# Patient Record
Sex: Male | Born: 1990 | Race: White | Hispanic: No | Marital: Single | Smoking: Heavy tobacco smoker
Health system: Southern US, Community
[De-identification: ages and names within clinical notes are randomized; demographics above are authoritative.]

## PROBLEM LIST (undated history)

## (undated) ENCOUNTER — Emergency Department (HOSPITAL_BASED_OUTPATIENT_CLINIC_OR_DEPARTMENT_OTHER): Admission: EM | Payer: Self-pay | Source: Home / Self Care

## (undated) DIAGNOSIS — I471 Supraventricular tachycardia, unspecified: Secondary | ICD-10-CM

## (undated) DIAGNOSIS — F419 Anxiety disorder, unspecified: Secondary | ICD-10-CM

## (undated) DIAGNOSIS — F602 Antisocial personality disorder: Secondary | ICD-10-CM

## (undated) DIAGNOSIS — F429 Obsessive-compulsive disorder, unspecified: Secondary | ICD-10-CM

## (undated) DIAGNOSIS — F909 Attention-deficit hyperactivity disorder, unspecified type: Secondary | ICD-10-CM

## (undated) HISTORY — PX: ANKLE ARTHROPLASTY: SUR68

---

## 2010-10-05 ENCOUNTER — Inpatient Hospital Stay (HOSPITAL_COMMUNITY)
Admission: AD | Admit: 2010-10-05 | Discharge: 2010-11-28 | Disposition: A | Discharge status: Other Acute Inpatient Hospital | Payer: Self-pay | Attending: Psychiatry | Admitting: Psychiatry

## 2010-10-05 ENCOUNTER — Ambulatory Visit: Payer: Self-pay | Admitting: Psychiatry

## 2011-02-06 DIAGNOSIS — X789XXA Intentional self-harm by unspecified sharp object, initial encounter: Secondary | ICD-10-CM | POA: Insufficient documentation

## 2011-02-06 DIAGNOSIS — R45851 Suicidal ideations: Secondary | ICD-10-CM | POA: Insufficient documentation

## 2011-02-06 DIAGNOSIS — F329 Major depressive disorder, single episode, unspecified: Secondary | ICD-10-CM | POA: Insufficient documentation

## 2011-02-06 DIAGNOSIS — F3289 Other specified depressive episodes: Secondary | ICD-10-CM | POA: Insufficient documentation

## 2011-02-06 DIAGNOSIS — IMO0002 Reserved for concepts with insufficient information to code with codable children: Secondary | ICD-10-CM | POA: Insufficient documentation

## 2011-02-06 LAB — COMPREHENSIVE METABOLIC PANEL
Alkaline Phosphatase: 89 U/L (ref 39–117)
BUN: 6 mg/dL (ref 6–23)
Chloride: 101 mEq/L (ref 96–112)
GFR calc non Af Amer: >60 mL/min (ref >60)
Glucose, Bld: 78 mg/dL (ref 70–99)
Potassium: 3.4 mEq/L — ABNORMAL LOW (ref 3.5–5.1)
Total Bilirubin: 0.6 mg/dL (ref 0.3–1.2)

## 2011-02-06 LAB — DIFFERENTIAL
Basophils Absolute: 0 10*3/uL (ref 0.0–0.1)
Basophils Relative: 0 % (ref 0–1)
Eosinophils Relative: 0 % (ref 0–5)
Monocytes Absolute: 1.5 10*3/uL — ABNORMAL HIGH (ref 0.1–1.0)
Neutro Abs: 10.1 10*3/uL — ABNORMAL HIGH (ref 1.7–7.7)

## 2011-02-06 LAB — RAPID URINE DRUG SCREEN, HOSP PERFORMED
Barbiturates: NOT DETECTED
Cocaine: NOT DETECTED
Opiates: NOT DETECTED

## 2011-02-06 LAB — CBC
Hemoglobin: 14.5 g/dL (ref 13.0–17.0)
MCHC: 33.9 g/dL (ref 30.0–36.0)
RDW: 12.3 % (ref 11.5–15.5)
WBC: 13.5 10*3/uL — ABNORMAL HIGH (ref 4.0–10.5)

## 2011-02-06 LAB — ETHANOL: Alcohol, Ethyl (B): <5 mg/dL (ref 0–10)

## 2011-02-07 ENCOUNTER — Emergency Department (HOSPITAL_COMMUNITY)
Admission: EM | Admit: 2011-02-07 | Discharge: 2011-02-10 | Disposition: A | Discharge status: Other Acute Inpatient Hospital | Payer: Self-pay | Attending: Emergency Medicine | Admitting: Emergency Medicine

## 2011-02-07 DIAGNOSIS — F39 Unspecified mood [affective] disorder: Secondary | ICD-10-CM

## 2011-02-08 DIAGNOSIS — F39 Unspecified mood [affective] disorder: Secondary | ICD-10-CM

## 2011-02-08 LAB — LITHIUM LEVEL: Lithium Lvl: <0.25 mEq/L — ABNORMAL LOW (ref 0.80–1.40)

## 2011-02-09 DIAGNOSIS — F39 Unspecified mood [affective] disorder: Secondary | ICD-10-CM

## 2011-02-20 LAB — URINALYSIS, ROUTINE W REFLEX MICROSCOPIC
Bilirubin Urine: NEGATIVE
Glucose, UA: NEGATIVE mg/dL
Hgb urine dipstick: NEGATIVE
Ketones, ur: NEGATIVE mg/dL
Protein, ur: NEGATIVE mg/dL
Urobilinogen, UA: 0.2 mg/dL (ref 0.0–1.0)

## 2011-02-20 LAB — T4, FREE: Free T4: 0.95 ng/dL (ref 0.80–1.80)

## 2011-02-20 LAB — LIPID PANEL
Cholesterol: 187 mg/dL (ref 0–200)
LDL Cholesterol: 106 mg/dL — ABNORMAL HIGH (ref 0–99)
Total CHOL/HDL Ratio: 4.2 RATIO

## 2011-02-20 LAB — BASIC METABOLIC PANEL
BUN: 16 mg/dL (ref 6–23)
CO2: 30 mEq/L (ref 19–32)
Chloride: 101 mEq/L (ref 96–112)
Creatinine, Ser: 0.83 mg/dL (ref 0.4–1.5)
GFR calc Af Amer: >60 mL/min (ref >60)

## 2011-02-20 LAB — LITHIUM LEVEL: Lithium Lvl: 0.3 mEq/L — ABNORMAL LOW (ref 0.80–1.40)

## 2011-02-20 LAB — HEMOGLOBIN A1C: Mean Plasma Glucose: 103 mg/dL (ref <117)

## 2011-02-20 LAB — T3, FREE: T3, Free: 3.3 pg/mL (ref 2.3–4.2)

## 2015-11-18 ENCOUNTER — Ambulatory Visit (HOSPITAL_COMMUNITY)
Admission: RE | Admit: 2015-11-18 | Discharge: 2015-11-18 | Disposition: A | Discharge status: Home / Self Care | Payer: Medicaid Other | Attending: Psychiatry | Admitting: Psychiatry

## 2015-11-18 NOTE — BH Assessment (Signed)
This Clinical research associatewriter consulted with Raphael GibneyFran Hopson, NP who stated that pt. to sign no harm contract and follow up with current outpatient provider. Sindy Mccune K. Sherlon HandingHarris, LCAS-A, LPC-A, [facility]  Counselor 11/18/2015 9:49 PM
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
## 2015-11-18 NOTE — BH Assessment (Signed)
This Probation officer spoke with roommate who accompanied patient to [facility] as walk in, and roommate states felt safe with patient returning home. Patient stated that he felt safe returning home and patient signed no harm contract. This Probation officer accompanied pt. To lobby where he met with roommate and was d/c to follow up with current outpatient provider. Mylene Bow K. Glendon Axe, [facility]  Counselor 11/18/2015 9:52 PM

## 2015-11-20 ENCOUNTER — Encounter (HOSPITAL_COMMUNITY): Payer: Self-pay | Admitting: Emergency Medicine

## 2015-11-20 ENCOUNTER — Emergency Department (HOSPITAL_COMMUNITY)
Admission: EM | Admit: 2015-11-20 | Discharge: 2015-11-21 | Disposition: A | Discharge status: Other Acute Inpatient Hospital | Payer: Medicaid Other | Attending: Emergency Medicine | Admitting: Emergency Medicine

## 2015-11-20 DIAGNOSIS — F603 Borderline personality disorder: Secondary | ICD-10-CM | POA: Diagnosis present

## 2015-11-20 DIAGNOSIS — Y998 Other external cause status: Secondary | ICD-10-CM | POA: Diagnosis not present

## 2015-11-20 DIAGNOSIS — I471 Supraventricular tachycardia: Secondary | ICD-10-CM | POA: Insufficient documentation

## 2015-11-20 DIAGNOSIS — R451 Restlessness and agitation: Secondary | ICD-10-CM | POA: Insufficient documentation

## 2015-11-20 DIAGNOSIS — F3181 Bipolar II disorder: Secondary | ICD-10-CM | POA: Diagnosis present

## 2015-11-20 DIAGNOSIS — F17210 Nicotine dependence, cigarettes, uncomplicated: Secondary | ICD-10-CM | POA: Insufficient documentation

## 2015-11-20 DIAGNOSIS — Y9289 Other specified places as the place of occurrence of the external cause: Secondary | ICD-10-CM | POA: Diagnosis not present

## 2015-11-20 DIAGNOSIS — F419 Anxiety disorder, unspecified: Secondary | ICD-10-CM | POA: Insufficient documentation

## 2015-11-20 DIAGNOSIS — X838XXA Intentional self-harm by other specified means, initial encounter: Secondary | ICD-10-CM | POA: Insufficient documentation

## 2015-11-20 DIAGNOSIS — R45851 Suicidal ideations: Secondary | ICD-10-CM | POA: Diagnosis not present

## 2015-11-20 DIAGNOSIS — F429 Obsessive-compulsive disorder, unspecified: Secondary | ICD-10-CM | POA: Diagnosis not present

## 2015-11-20 DIAGNOSIS — Z79899 Other long term (current) drug therapy: Secondary | ICD-10-CM | POA: Insufficient documentation

## 2015-11-20 DIAGNOSIS — Y9389 Activity, other specified: Secondary | ICD-10-CM | POA: Insufficient documentation

## 2015-11-20 DIAGNOSIS — Z7289 Other problems related to lifestyle: Secondary | ICD-10-CM

## 2015-11-20 DIAGNOSIS — S6991XA Unspecified injury of right wrist, hand and finger(s), initial encounter: Secondary | ICD-10-CM | POA: Insufficient documentation

## 2015-11-20 HISTORY — DX: Supraventricular tachycardia: I47.1

## 2015-11-20 HISTORY — DX: Anxiety disorder, unspecified: F41.9

## 2015-11-20 HISTORY — DX: Antisocial personality disorder: F60.2

## 2015-11-20 HISTORY — DX: Obsessive-compulsive disorder, unspecified: F42.9

## 2015-11-20 HISTORY — DX: Supraventricular tachycardia, unspecified: I47.10
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
## 2015-11-20 NOTE — ED Notes (Signed)
Bed: WA08 Expected date:  Expected time:  Means of arrival:  Comments: EMS 24yo M rt hand pain / injury 6 hrs ago

## 2015-11-21 ENCOUNTER — Encounter (HOSPITAL_COMMUNITY): Payer: Self-pay | Admitting: General Practice

## 2015-11-21 ENCOUNTER — Emergency Department (HOSPITAL_COMMUNITY): Payer: Medicaid Other

## 2015-11-21 ENCOUNTER — Inpatient Hospital Stay (HOSPITAL_COMMUNITY)
Admission: AD | Admit: 2015-11-21 | Discharge: 2015-11-24 | DRG: 885 | Disposition: A | Discharge status: Home / Self Care | Payer: Medicaid Other | Source: Intra-hospital | Attending: Psychiatry | Admitting: Psychiatry

## 2015-11-21 DIAGNOSIS — F1410 Cocaine abuse, uncomplicated: Secondary | ICD-10-CM | POA: Diagnosis present

## 2015-11-21 DIAGNOSIS — F639 Impulse disorder, unspecified: Secondary | ICD-10-CM | POA: Diagnosis not present

## 2015-11-21 DIAGNOSIS — R45851 Suicidal ideations: Secondary | ICD-10-CM

## 2015-11-21 DIAGNOSIS — F17210 Nicotine dependence, cigarettes, uncomplicated: Secondary | ICD-10-CM | POA: Diagnosis present

## 2015-11-21 DIAGNOSIS — F603 Borderline personality disorder: Secondary | ICD-10-CM | POA: Diagnosis present

## 2015-11-21 DIAGNOSIS — F319 Bipolar disorder, unspecified: Secondary | ICD-10-CM | POA: Diagnosis present

## 2015-11-21 DIAGNOSIS — F602 Antisocial personality disorder: Secondary | ICD-10-CM | POA: Diagnosis present

## 2015-11-21 DIAGNOSIS — Z818 Family history of other mental and behavioral disorders: Secondary | ICD-10-CM | POA: Diagnosis not present

## 2015-11-21 DIAGNOSIS — G4700 Insomnia, unspecified: Secondary | ICD-10-CM | POA: Diagnosis present

## 2015-11-21 DIAGNOSIS — S6991XA Unspecified injury of right wrist, hand and finger(s), initial encounter: Secondary | ICD-10-CM | POA: Diagnosis not present

## 2015-11-21 DIAGNOSIS — F3163 Bipolar disorder, current episode mixed, severe, without psychotic features: Secondary | ICD-10-CM | POA: Diagnosis not present

## 2015-11-21 DIAGNOSIS — F1210 Cannabis abuse, uncomplicated: Secondary | ICD-10-CM | POA: Diagnosis present

## 2015-11-21 DIAGNOSIS — F3181 Bipolar II disorder: Secondary | ICD-10-CM | POA: Diagnosis present

## 2015-11-21 DIAGNOSIS — F429 Obsessive-compulsive disorder, unspecified: Secondary | ICD-10-CM | POA: Diagnosis not present

## 2015-11-21 LAB — RAPID URINE DRUG SCREEN, HOSP PERFORMED
AMPHETAMINES: NOT DETECTED
BENZODIAZEPINES: NOT DETECTED
Barbiturates: NOT DETECTED
COCAINE: POSITIVE — AB
OPIATES: NOT DETECTED
TETRAHYDROCANNABINOL: POSITIVE — AB

## 2015-11-21 LAB — CBC WITH DIFFERENTIAL/PLATELET
BASOS ABS: 0 10*3/uL (ref 0.0–0.1)
Basophils Relative: 0 %
Eosinophils Absolute: 0 10*3/uL (ref 0.0–0.7)
Eosinophils Relative: 0 %
HEMATOCRIT: 42.7 % (ref 39.0–52.0)
Hemoglobin: 14.8 g/dL (ref 13.0–17.0)
LYMPHS PCT: 15 %
Lymphs Abs: 1.7 10*3/uL (ref 0.7–4.0)
MCH: 31.2 pg (ref 26.0–34.0)
MCHC: 34.7 g/dL (ref 30.0–36.0)
MCV: 90.1 fL (ref 78.0–100.0)
MONO ABS: 0.7 10*3/uL (ref 0.1–1.0)
Monocytes Relative: 7 %
NEUTROS ABS: 8.6 10*3/uL — AB (ref 1.7–7.7)
NEUTROS PCT: 78 %
Platelets: 211 10*3/uL (ref 150–400)
RBC: 4.74 MIL/uL (ref 4.22–5.81)
RDW: 12.9 % (ref 11.5–15.5)
WBC: 11 10*3/uL — ABNORMAL HIGH (ref 4.0–10.5)

## 2015-11-21 LAB — I-STAT CHEM 8, ED
BUN: 7 mg/dL (ref 6–20)
CHLORIDE: 98 mmol/L — AB (ref 101–111)
CREATININE: 0.9 mg/dL (ref 0.61–1.24)
Calcium, Ion: 1.18 mmol/L (ref 1.12–1.23)
GLUCOSE: 92 mg/dL (ref 65–99)
HCT: 47 % (ref 39.0–52.0)
Hemoglobin: 16 g/dL (ref 13.0–17.0)
POTASSIUM: 3.5 mmol/L (ref 3.5–5.1)
Sodium: 138 mmol/L (ref 135–145)
TCO2: 25 mmol/L (ref 0–100)

## 2015-11-21 LAB — ETHANOL

## 2015-11-21 MED ORDER — METOPROLOL SUCCINATE ER 25 MG PO TB24
25.0000 mg | ORAL_TABLET | Freq: Every day | ORAL | Status: DC
  Administered 2015-11-22 – 2015-11-24 (×3): 25 mg via ORAL
  Filled 2015-11-21 (×5): qty 1

## 2015-11-21 MED ORDER — MAGNESIUM HYDROXIDE 400 MG/5ML PO SUSP
30.0000 mL | Freq: Every day | ORAL | Status: DC | PRN
  Administered 2015-11-23: 30 mL via ORAL
  Filled 2015-11-21: qty 30

## 2015-11-21 MED ORDER — QUETIAPINE FUMARATE 100 MG PO TABS
100.0000 mg | ORAL_TABLET | Freq: Every evening | ORAL | Status: DC | PRN
  Administered 2015-11-21 – 2015-11-23 (×3): 100 mg via ORAL
  Filled 2015-11-21 (×9): qty 1

## 2015-11-21 MED ORDER — NICOTINE 21 MG/24HR TD PT24
21.0000 mg | MEDICATED_PATCH | Freq: Once | TRANSDERMAL | Status: DC
  Administered 2015-11-21: 21 mg via TRANSDERMAL
  Filled 2015-11-21: qty 1

## 2015-11-21 MED ORDER — SERTRALINE HCL 50 MG PO TABS
200.0000 mg | ORAL_TABLET | Freq: Once | ORAL | Status: AC
  Administered 2015-11-21: 200 mg via ORAL

## 2015-11-21 MED ORDER — ACETAMINOPHEN 325 MG PO TABS
650.0000 mg | ORAL_TABLET | ORAL | Status: DC | PRN
  Administered 2015-11-22: 650 mg via ORAL
  Filled 2015-11-21: qty 2

## 2015-11-21 MED ORDER — OXCARBAZEPINE 300 MG PO TABS
600.0000 mg | ORAL_TABLET | Freq: Once | ORAL | Status: AC
  Administered 2015-11-21: 600 mg via ORAL
  Filled 2015-11-21: qty 2

## 2015-11-21 MED ORDER — ONDANSETRON HCL 4 MG PO TABS: 4.0000 mg | ORAL_TABLET | Freq: Three times a day (TID) | ORAL | Status: DC | PRN

## 2015-11-21 MED ORDER — SERTRALINE HCL 50 MG PO TABS
200.0000 mg | ORAL_TABLET | Freq: Every day | ORAL | Status: DC
  Filled 2015-11-21: qty 4

## 2015-11-21 MED ORDER — HYDROXYZINE HCL 25 MG PO TABS
25.0000 mg | ORAL_TABLET | Freq: Four times a day (QID) | ORAL | Status: DC | PRN
  Administered 2015-11-21 – 2015-11-22 (×2): 25 mg via ORAL
  Filled 2015-11-21 (×2): qty 1

## 2015-11-21 MED ORDER — ACETAMINOPHEN 325 MG PO TABS
650.0000 mg | ORAL_TABLET | ORAL | Status: DC | PRN
  Administered 2015-11-21 (×3): 650 mg via ORAL
  Filled 2015-11-21 (×3): qty 2

## 2015-11-21 MED ORDER — NICOTINE POLACRILEX 2 MG MT GUM
2.0000 mg | CHEWING_GUM | OROMUCOSAL | Status: DC | PRN
  Administered 2015-11-22 – 2015-11-24 (×10): 2 mg via ORAL
  Filled 2015-11-21 (×2): qty 1

## 2015-11-21 MED ORDER — SERTRALINE HCL 100 MG PO TABS
200.0000 mg | ORAL_TABLET | Freq: Every day | ORAL | Status: DC
  Administered 2015-11-22 – 2015-11-24 (×3): 200 mg via ORAL
  Filled 2015-11-21 (×4): qty 2

## 2015-11-21 MED ORDER — SERTRALINE HCL 50 MG PO TABS: 300.0000 mg | ORAL_TABLET | Freq: Every day | ORAL | Status: DC

## 2015-11-21 MED ORDER — SILVER SULFADIAZINE 1 % EX CREA
TOPICAL_CREAM | Freq: Once | CUTANEOUS | Status: AC
  Administered 2015-11-21: 02:00:00 via TOPICAL
  Filled 2015-11-21: qty 50

## 2015-11-21 MED ORDER — ACETAMINOPHEN 325 MG PO TABS
650.0000 mg | ORAL_TABLET | Freq: Four times a day (QID) | ORAL | Status: DC | PRN
  Administered 2015-11-21: 650 mg via ORAL
  Filled 2015-11-21: qty 2

## 2015-11-21 MED ORDER — OXCARBAZEPINE 300 MG PO TABS
600.0000 mg | ORAL_TABLET | Freq: Two times a day (BID) | ORAL | Status: DC
  Administered 2015-11-21 – 2015-11-24 (×6): 600 mg via ORAL
  Filled 2015-11-21 (×9): qty 2

## 2015-11-21 MED ORDER — NICOTINE 21 MG/24HR TD PT24
21.0000 mg | MEDICATED_PATCH | Freq: Every day | TRANSDERMAL | Status: DC
  Administered 2015-11-21: 21 mg via TRANSDERMAL
  Filled 2015-11-21: qty 1

## 2015-11-21 MED ORDER — OXCARBAZEPINE 300 MG PO TABS: 300.0000 mg | ORAL_TABLET | Freq: Three times a day (TID) | ORAL | Status: DC

## 2015-11-21 MED ORDER — METOPROLOL SUCCINATE ER 25 MG PO TB24
25.0000 mg | ORAL_TABLET | Freq: Every day | ORAL | Status: DC
  Administered 2015-11-21: 25 mg via ORAL
  Filled 2015-11-21 (×2): qty 1

## 2015-11-21 MED ORDER — LORAZEPAM 1 MG PO TABS
1.0000 mg | ORAL_TABLET | Freq: Three times a day (TID) | ORAL | Status: DC | PRN
  Administered 2015-11-21: 1 mg via ORAL
  Filled 2015-11-21: qty 1

## 2015-11-21 MED ORDER — OXCARBAZEPINE 300 MG PO TABS
600.0000 mg | ORAL_TABLET | Freq: Two times a day (BID) | ORAL | Status: DC
  Filled 2015-11-21: qty 2

## 2015-11-21 NOTE — ED Notes (Signed)
Ortho tech here to apply splint Ulnar gutter.
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
## 2015-11-21 NOTE — BH Assessment (Addendum)
[facility] Assessment Progress Note  Per Thedore MinsMojeed Akintayo, MD, this pt requires psychiatric hospitalization at this time.  Rosey BathKelly Southard, RN, [facility], but bed has not yet been vacated and cleaned.  Pt is under IVC initiated by the EDP.  Pt has signed Consent to Release Information to [facility], and a notification call has been placed.  Signed [facility] with [facility] documents.  Pt's nurse has been notified, and once bed becomes available, agrees to send original paperwork along with pt via law enforcement, and to call report to [phone].  Doylene Canninghomas Ayianna Darnold, MA Triage Specialist [phone]
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
## 2015-11-21 NOTE — Progress Notes (Signed)
Entered in d/c instructions   [facility] Schedule an appointment as soon as possible for a visit This is your assigned Medicaid [place] access doctor If you prefer another contact DSS [phone] DSS assigned your doctor *You may receive a bill if you go to any family Dr not assigned to you Medicaid [place] access response hx [facility] [address] [place], [place] [phone]  Medicaid [place] Access Covered Patient USE THIS WEBSITE TO ASSIST WITH UNDERSTANDING YOUR COVERAGE, RENEW APPLICATION [place] Medicaid Transportation to Dr appts if you are have full Medicaid: [phone], [phone] or [phone] Transportation Supervisor [phone] As a Medicaid client you MUST contact DSS/SSI each time you change address, move to another [place] county or another state to keep your address updated [place]: [phone] [address] [place], [place] [address] [email or web address]

## 2015-11-21 NOTE — Progress Notes (Signed)
D: Patient in the hallway on approach.  Patient states, "I need Klonopin."  Patient states he gets agitated from time to time and the only thing that helps is Klonopin.  Donell SievertSpencer Simon notified and Klonopin not administered at this time. Patient states he does get nervous at times and picks at hs fingernails but states he will tell  patient denies SI/HI and denies AVH. A: Staff to monitor Q 15 mins for safety.  Encouragement and support offered.  Scheduled medications administered per orders.  Vistaril administered prn for anxiety. R: Patient remains safe on the unit.  Patient attended group tonight.  Patient visible on the unit and interacting with peers.  Patient taking administered medications
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
## 2015-11-21 NOTE — ED Notes (Signed)
Psychiatry at bedside.

## 2015-11-21 NOTE — Tx Team (Signed)
Initial Interdisciplinary Treatment Plan   PATIENT STRESSORS: Legal issue Marital or family conflict Traumatic event   PATIENT STRENGTHS: General fund of knowledge Supportive family/friends   PROBLEM LIST: Problem List/Patient Goals Date to be addressed Date deferred Reason deferred Estimated date of resolution  Suicide attempt 11/21/15     Depression 11/21/15     Anxiety 11/21/15     "wear my gloves" 11/21/15     "take klonopin" 11/21/15                              DISCHARGE CRITERIA:  Improved stabilization in mood, thinking, and/or behavior Need for constant or close observation no longer present Reduction of life-threatening or endangering symptoms to within safe limits  PRELIMINARY DISCHARGE PLAN: Outpatient therapy Return to previous living arrangement  PATIENT/FAMIILY INVOLVEMENT: This treatment plan has been presented to and reviewed with the patient, Emeline GinsJoseph Gressman.  The patient and family have been given the opportunity to ask questions and make suggestions.  Levin BaconHeather V Jackquline Branca 11/21/2015, 6:47 PM

## 2015-11-21 NOTE — Progress Notes (Signed)
Adult Psychoeducational Group Note  Date:  11/21/2015 Time:  9:57 PM  Group Topic/Focus:  Wrap-Up Group:   The focus of this group is to help patients review their daily goal of treatment and discuss progress on daily workbooks.  Participation Level:  Active  Participation Quality:  Appropriate  Affect:  Appropriate  Cognitive:  Appropriate  Insight: Good  Engagement in Group:  Engaged  Modes of Intervention:  Activity  Additional Comments:  Patient rated his day a 3. Goal is to control agitation and reduce self-mutilation.  Natasha MeadKiara M Shayleen Eppinger 11/21/2015, 9:57 PM

## 2015-11-21 NOTE — ED Notes (Signed)
Pt states that he will need to be IVC'd "if we have any hope of preventing self-mutilating" and reports that he has nail clippers and a lighter on his person at this time.  He has been provided with paper scrubs and wanded by security.  Consulting civil engineerCharge RN and physician notified of pt's verbalized intent to self-harm.  Q15 minute checks initiated.

## 2015-11-21 NOTE — ED Notes (Signed)
Bed: WA11 Expected date:  Expected time:  Means of arrival:  Comments: Rm 8 

## 2015-11-21 NOTE — ED Notes (Signed)
TTS consult via telepsych in process at this time. 
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
## 2015-11-21 NOTE — [facility] Counselor (Signed)
Pt intake package sent to the following facilities for consideration for admission:  [facility]  Adair Lemar, MS, CRC, LPC [facility] Triage Specialist [place] 

## 2015-11-22 ENCOUNTER — Encounter (HOSPITAL_COMMUNITY): Payer: Self-pay | Admitting: Psychiatry

## 2015-11-22 DIAGNOSIS — F639 Impulse disorder, unspecified: Secondary | ICD-10-CM | POA: Diagnosis present

## 2015-11-22 DIAGNOSIS — F429 Obsessive-compulsive disorder, unspecified: Secondary | ICD-10-CM | POA: Diagnosis present

## 2015-11-22 MED ORDER — TOPIRAMATE 25 MG PO TABS
25.0000 mg | ORAL_TABLET | Freq: Two times a day (BID) | ORAL | Status: DC
Start: 2015-11-22 — End: 2015-11-23
  Administered 2015-11-22 – 2015-11-23 (×2): 25 mg via ORAL
  Filled 2015-11-22 (×6): qty 1

## 2015-11-22 MED ORDER — CLONAZEPAM 1 MG PO TABS
1.0000 mg | ORAL_TABLET | Freq: Every day | ORAL | Status: DC
  Administered 2015-11-22 – 2015-11-23 (×2): 1 mg via ORAL
  Filled 2015-11-22 (×2): qty 1

## 2015-11-22 MED ORDER — CLONAZEPAM 1 MG PO TABS
ORAL_TABLET | ORAL | Status: AC
  Filled 2015-11-22: qty 1

## 2015-11-22 MED ORDER — CLONAZEPAM 1 MG PO TABS: 1.0000 mg | ORAL_TABLET | Freq: Two times a day (BID) | ORAL | Status: DC

## 2015-11-22 MED ORDER — ENSURE ENLIVE PO LIQD: 237.0000 mL | Freq: Two times a day (BID) | ORAL | Status: DC

## 2015-11-22 MED ORDER — CLONAZEPAM 1 MG PO TABS
1.0000 mg | ORAL_TABLET | Freq: Two times a day (BID) | ORAL | Status: DC
  Administered 2015-11-22 – 2015-11-24 (×5): 1 mg via ORAL
  Filled 2015-11-22 (×4): qty 1

## 2015-11-22 NOTE — [facility] Group Notes (Signed)
[facility] Group Notes:  (Nursing/MHT/Case Management/Adjunct)  Date:  11/22/2015  Time:  0900  Type of Therapy:  Nurse Education  Participation Level:  Did Not Attend  Participation Quality:    Affect:    Cognitive:    Insight:    Engagement in Group:    Modes of Intervention:    Summary of Progress/Problems: Patient invited to attend group however elected to remain in bed.   Merian CapronFriedman, [facility] 11/22/2015, 0930
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
## 2015-11-22 NOTE — Progress Notes (Signed)
Attended group 

## 2015-11-22 NOTE — [facility] Suicide Risk Assessment (Signed)
[facility] INPATIENT:  Family/Significant Other Suicide Prevention Education  Suicide Prevention Education:  Education Completed; Karl Carroll (pt's boyfriend) [phone] has been identified by the patient as the family member/significant other with whom the patient will be residing, and identified as the person(s) who will aid the patient in the event of a mental health crisis (suicidal ideations/suicide attempt).  With written consent from the patient, the family member/significant other has been provided the following suicide prevention education, prior to the and/or following the discharge of the patient.  The suicide prevention education provided includes the following:  Suicide risk factors  Suicide prevention and interventions  National Suicide Hotline telephone number  [facility] assessment telephone number  [facility] Assistance 911  [facility] and/or [facility] Crisis [facility] telephone number  Request made of family/significant other to:  Remove weapons (e.g., guns, rifles, knives), all items previously/currently identified as safety concern.    Remove drugs/medications (over-the-counter, prescriptions, illicit drugs), all items previously/currently identified as a safety concern.  The family member/significant other verbalizes understanding of the suicide prevention education information provided.  The family member/significant other agrees to remove the items of safety concern listed above.  Smart, Karl Heslin LCSW 11/22/2015, 11:19 AM

## 2015-11-22 NOTE — Progress Notes (Signed)
Patient elected to remain in bed until late morning. Prompted twice to come to NS for medications however refused. "I received my tylenol. I'm fine." Met 1:1 with patient once he arose for the day. Affect animated, mood anxious. Rates his anxiety at an 8/10 and denies any depression or hopelessness.  Asking that arm wrap be removed so that he can "get in a workout before her showers." Insisted wrap be removed and disposed of along with the soft cast. Reports pain is worse with the was his hand/wrist are positioned. Rating pain a 4/10. States his goal is to use "cognitive management" by "concentrating." Patient offered emotional support, medicated per orders and med education provided. Removed wrap/cast per patient's request. Observed patient with good ROM and patient reports minimal pain with some stiffness. During AM admin of klonopin patient states, "I'm going to chew it and the doctor knows that. We agreed since normally at home I snort them." Patient denies SI/HI at this time and remains safe on level III obs. Will continue to monitor closely. Jamie Kato

## 2015-11-22 NOTE — [facility] Suicide Risk Assessment (Signed)
[facility]   Nursing information obtained from:    Demographic factors:    Current Mental Status:    Loss Factors:    Historical Factors:    Risk Reduction Factors:    Total Time spent with patient: 45 minutes Principal Problem: Bipolar affective disorder, current episode severe (HCC) Diagnosis:   Patient Active Problem List   Diagnosis Date Noted  . OCD (obsessive compulsive disorder) [F42.9] 11/22/2015  . Impulse control disorder [F63.9] 11/22/2015  . Borderline personality disorder [F60.3] 11/21/2015  . Bipolar 2 disorder (HCC) [F31.81] 11/21/2015  . Bipolar affective disorder, current episode severe (HCC) [F31.9] 11/21/2015     Continued Clinical Symptoms:  Alcohol Use Disorder Identification Test Final Score (AUDIT): 1 The "Alcohol Use Disorders Identification Test", Guidelines for Use in Primary Care, Second Edition.  World Science writerHealth Organization [facility](WHO). Score between 0-7:  no or low risk or alcohol related problems. Score between 8-15:  moderate risk of alcohol related problems. Score between 16-19:  high risk of alcohol related problems. Score 20 or above:  warrants further diagnostic evaluation for alcohol dependence and treatment.   CLINICAL FACTORS:   Severe Anxiety and/or Agitation Bipolar Disorder:   Mixed State Obsessive-Compulsive Disorder   Psychiatric Specialty Exam: Physical Exam  ROS  Blood pressure 105/66, pulse 70, temperature 97.6 F (36.4 C), temperature source Oral, resp. rate 16, height 5\' 8"  (1.727 m), weight 106.142 kg (234 lb), SpO2 97 %.Body mass index is 35.59 kg/(m^2).    COGNITIVE FEATURES THAT CONTRIBUTE TO RISK:  Closed-mindedness, Polarized thinking and Thought constriction (tunnel vision)    SUICIDE RISK:   Moderate:  Frequent suicidal ideation with limited intensity, and duration, some specificity in terms of plans, no associated intent, good self-control, limited dysphoria/symptomatology, some risk factors  present, and identifiable protective factors, including available and accessible social support.  PLAN OF CARE: see admission H and P  Medical Decision Making:  Review of Psycho-Social Stressors (1), Review or order clinical lab tests (1), Review of Medication Regimen & Side Effects (2) and Review of New Medication or Change in Dosage (2)  I certify that inpatient services furnished can reasonably be expected to improve the patient's condition.   Kingson Lohmeyer A 11/22/2015, 5:51 PM
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
## 2015-11-22 NOTE — Progress Notes (Signed)
Patient has been attention seeking much of this afternoon. Asking that staff provide him sharps - "don't you have tooth picks? A metal nail file? Can I have the package of that ativan? I like to stab sharp items up under my nails. I don't feel pain but it makes me feel better." Patient asking repeatedly for klonopin. "Why can't I get it more often and snort it? That's what I do at home. I'm not here for substance abuse or to detox. I'm here to work on my self mutilating behaviors and meds." Redirected behavior as needed. Offered emotional support and gently suggested patient consider other, healthier coping skills. Patient minimally receptive. Observed fingers where patient had peeled off chunks of his nails. No bleeding, drainage. Encouraged thorough handwashing. Karl Carroll, Karl Carroll

## 2015-11-22 NOTE — Progress Notes (Signed)
Recreation Therapy Notes  Animal-Assisted Activity (AAA) Program Checklist/Progress Notes Patient Eligibility Criteria Checklist & Daily Group note for Rec Tx Intervention  Date: 12.13.2016  Time: 2:45pm [facility] [address]    AAA/T Program Assumption of Risk Form signed by Patient/ or Parent Legal Guardian yes  Patient is free of allergies or sever asthma yes  Patient reports no fear of animals yes  Patient reports no history of cruelty to animals yes  Patient understands his/her participation is voluntary yes  Patient washes hands before animal contact yes  Patient washes hands after animal contact yes  Behavioral Response: Appropriate   Education: Hand Washing, Appropriate Animal Interaction   Education Outcome: Acknowledges education.   Clinical Observations/Feedback: Patient engaged appropriately in pet therapy session, interacting with therapy dog and peers appropriately during session.   Marykay Lexenise L Natividad Halls, LRT/CTRS  Jearl KlinefelterBlanchfield, Jameelah Watts L 11/22/2015 3:16 PM

## 2015-11-22 NOTE — [facility] Group Notes (Signed)
[facility] LCSW Group Therapy  11/22/2015 1:01 PM  Type of Therapy:  Group Therapy  Participation Level:  Active  Participation Quality:  Attentive  Affect:  Appropriate  Cognitive:  Appropriate  Insight:  Engaged  Engagement in Therapy:  Improving  Modes of Intervention:  Confrontation, Discussion, Education, Exploration, Problem-solving, Rapport Building, Socialization and Support  Summary of Progress/Problems: [facility] Speaker came to talk about his personal journey with substance abuse and addiction. The pt processed ways by which to relate to the speaker. [facility] speaker provided handouts and educational information pertaining to groups and services offered by [facility].   Smart, Karl Goldwire LCSW 11/22/2015, 1:01 PM
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
## 2015-11-23 MED ORDER — TOPIRAMATE 25 MG PO TABS
50.0000 mg | ORAL_TABLET | Freq: Two times a day (BID) | ORAL | Status: DC
  Administered 2015-11-23 – 2015-11-24 (×2): 50 mg via ORAL
  Filled 2015-11-23 (×4): qty 2

## 2015-11-23 NOTE — [facility] Group Notes (Signed)
[facility] LCSW Group Therapy  11/23/2015 3:21 PM  Type of Therapy:  Group Therapy  Participation Level:  Active  Participation Quality:  Attentive  Affect:  Appropriate  Cognitive:  Alert and Oriented  Insight:  Engaged  Engagement in Therapy:  Improving  Modes of Intervention:  Confrontation, Discussion, Education, Exploration, Problem-solving, Rapport Building, Socialization and Support  Summary of Progress/Problems: Emotion Regulation: This group focused on both positive and negative emotion identification and allowed group members to process ways to identify feelings, regulate negative emotions, and find healthy ways to manage internal/external emotions. Group members were asked to reflect on a time when their reaction to an emotion led to a negative outcome and explored how alternative responses using emotion regulation would have benefited them. Group members were also asked to discuss a time when emotion regulation was utilized when a negative emotion was experienced.   Smart, Dudley Mages LCSW 11/23/2015, 3:21 PM

## 2015-11-23 NOTE — [facility] Group Notes (Signed)
[name]   11/23/2015 9:39 AM  Participation Quality:  Invited. Chose to remain in bed. DID NOT ATTEND  Smart, Karl Allums LCSW

## 2015-11-23 NOTE — Progress Notes (Signed)
Patient slept until lunch. States, "I don't sleep when I'm at home. But at a place like this where I'm locked down, I sleep to deal with it." Patient animated in affect, mood anxious, irritable at times. Continues to be intrusive and inappropriate in comments. "The doctor said I could have 10mg  of valium and 30mg  of oxycontin." Continues to chew klonopin and is unable to be redirected. Rates his depression a "7/10 while here", denies hopelessness and reports anxiety is a 9/10. "See, the doctor doesn't understand that in here I'm one way but out there, I'm in an agitated state." Asked patient what coping skills help to which patient responded, "punching trees I guess." Attempted 1:1 with patient regarding true coping skills however patient unreceptive. Given meds per orders. Redirected as needed. Patient denies pain, physical problems. Denies SI, thoughts to self harm and HI. Will continue to monitor closely. Remains on level III obs. Lawrence MarseillesFriedman, Bard Haupert Eakes

## 2015-11-23 NOTE — Progress Notes (Signed)
Pt attended evening AA group. 

## 2015-11-23 NOTE — Plan of Care (Signed)
Problem: Alteration in mood; excessive anxiety as evidenced by: Goal: STG-Pt will report an absence of self-harm thoughts/actions (Patient will report an absence of self-harm thoughts or actions)  Outcome: Progressing Patient states, "I will always have thoughts to self mutilate. It makes me feel better." Able to contract for safety, denies SI.  Problem: Diagnosis: Increased Risk For Suicide Attempt Goal: STG-Patient Will Comply With Medication Regime Outcome: Progressing Patient has been med compliant.

## 2015-11-23 NOTE — Progress Notes (Signed)
[facility] MD Progress Note  11/23/2015 6:49 PM Karl Carroll  MRN:  [id number] Subjective:  Karl Carroll states he has been trying to sleep during the morning. He has sensations/pain in the burnt area and he tries to focus on this pain as well as picks on his skin and pull the skin on the nails beds to experience pain so he can " put his emotional pain there" He is endorses he is having some benefit from the Klonopin at least 4 hours. Not sure what to expect from the Topamax. He continues to assert he does not want to take medications that will cause weight gain Principal Problem: Bipolar affective disorder, current episode severe (HCC) Diagnosis:   Patient Active Problem List   Diagnosis Date Noted  . OCD (obsessive compulsive disorder) [F42.9] 11/22/2015  . Impulse control disorder [F63.9] 11/22/2015  . Borderline personality disorder [F60.3] 11/21/2015  . Bipolar 2 disorder (HCC) [F31.81] 11/21/2015  . Bipolar affective disorder, current episode severe (HCC) [F31.9] 11/21/2015   Total Time spent with patient: 30 minutes  Past Psychiatric History: see admission H and P  Past Medical History:  Past Medical History  Diagnosis Date  . SVT (supraventricular tachycardia) (HCC)   . Anxiety   . OCD (obsessive compulsive disorder)   . Antisocial personality disorder     Past Surgical History  Procedure Laterality Date  . Ankle arthroplasty Bilateral    Family History: History reviewed. No pertinent family history. Family Psychiatric  History: see admission H and P Social History:  History  Alcohol Use  . Yes    Comment: occ     History  Drug Use  . Yes  . Special: Marijuana    Comment: 11/20/2015    Social History   Social History  . Marital Status: Single    Spouse Name: N/A  . Number of Children: N/A  . Years of Education: N/A   Social History Main Topics  . Smoking status: Heavy Tobacco Smoker    Types: Cigarettes  . Smokeless tobacco: Current User    Types: Snuff  . Alcohol  Use: Yes     Comment: occ  . Drug Use: Yes    Special: Marijuana     Comment: 11/20/2015  . Sexual Activity: Yes   Other Topics Concern  . None   Social History Narrative   Additional Social History:                         Sleep: Fair  Appetite:  Fair  Current Medications: Current Facility-Administered Medications  Medication Dose Route Frequency Provider Last Rate Last Dose  . acetaminophen (TYLENOL) tablet 650 mg  650 mg Oral Q4H PRN Charm Rings, NP   650 mg at 11/22/15 0604  . clonazePAM (KLONOPIN) tablet 1 mg  1 mg Oral QHS Rachael Fee, MD   1 mg at 11/22/15 2112  . clonazePAM (KLONOPIN) tablet 1 mg  1 mg Oral BID Rachael Fee, MD   1 mg at 11/23/15 1615  . feeding supplement (ENSURE ENLIVE) (ENSURE ENLIVE) liquid 237 mL  237 mL Oral BID BM Tilda Franco, RD   237 mL at 11/22/15 1537  . hydrOXYzine (ATARAX/VISTARIL) tablet 25 mg  25 mg Oral Q6H PRN Kerry Hough, PA-C   25 mg at 11/22/15 1512  . magnesium hydroxide (MILK OF MAGNESIA) suspension 30 mL  30 mL Oral Daily PRN Charm Rings, NP   30 mL at 11/23/15 0008  .  metoprolol succinate (TOPROL-XL) 24 hr tablet 25 mg  25 mg Oral Daily Charm Rings, NP   25 mg at 11/23/15 1209  . nicotine polacrilex (NICORETTE) gum 2 mg  2 mg Oral PRN Rachael Fee, MD   2 mg at 11/23/15 1807  . Oxcarbazepine (TRILEPTAL) tablet 600 mg  600 mg Oral BID Charm Rings, NP   600 mg at 11/23/15 1613  . QUEtiapine (SEROQUEL) tablet 100 mg  100 mg Oral QHS,MR X 1 Kerry Hough, PA-C   100 mg at 11/22/15 2111  . sertraline (ZOLOFT) tablet 200 mg  200 mg Oral Daily Charm Rings, NP   200 mg at 11/23/15 1209  . topiramate (TOPAMAX) tablet 50 mg  50 mg Oral BID Rachael Fee, MD   50 mg at 11/23/15 1613    Lab Results: No results found for this or any previous visit (from the past 48 hour(s)).  Physical Findings: AIMS: Facial and Oral Movements Muscles of Facial Expression: None, normal Lips and Perioral Area: None,  normal Jaw: None, normal Tongue: None, normal,Extremity Movements Upper (arms, wrists, hands, fingers): None, normal Lower (legs, knees, ankles, toes): None, normal, Trunk Movements Neck, shoulders, hips: None, normal, Overall Severity Severity of abnormal movements (highest score from questions above): None, normal Incapacitation due to abnormal movements: None, normal Patient's awareness of abnormal movements (rate only patient's report): No Awareness, Dental Status Current problems with teeth and/or dentures?: No Does patient usually wear dentures?: No  CIWA:    COWS:     Musculoskeletal: Strength & Muscle Tone: within normal limits Gait & Station: normal Patient leans: normal  Psychiatric Specialty Exam: Review of Systems  Constitutional: Negative.   HENT: Negative.   Eyes: Negative.   Respiratory: Negative.   Cardiovascular: Negative.   Gastrointestinal: Negative.   Genitourinary: Negative.   Musculoskeletal: Negative.   Skin: Negative.   Neurological: Negative.   Endo/Heme/Allergies: Negative.   Psychiatric/Behavioral: The patient is nervous/anxious.     Blood pressure 111/65, pulse 60, temperature 97.9 F (36.6 C), temperature source Oral, resp. rate 20, height  (1.727 m), weight 106.142 kg (234 lb), SpO2 97 %.Body mass index is 35.59 kg/(m^2).  General Appearance: Fairly Groomed  Patent attorney::  Fair  Speech:  Clear and Coherent  Volume:  Normal  Mood:  Dysphoric  Affect:  Restricted  Thought Process:  Coherent and Goal Directed  Orientation:  Full (Time, Place, and Person)  Thought Content:  symptoms events worries concerns  Suicidal Thoughts:  No  Homicidal Thoughts:  No  Memory:  Immediate;   Fair Recent;   Fair Remote;   Fair  Judgement:  Fair  Insight:  Present and Shallow  Psychomotor Activity:  Restlessness  Concentration:  Fair  Recall:  [employer] of Knowledge:Fair  Language: Fair  Akathisia:  No  Handed:  Right  AIMS (if indicated):      Assets:  Desire for Improvement Housing Social Support  ADL's:  Intact  Cognition: WNL  Sleep:  Number of Hours: 5.75   Treatment Plan Summary: Daily contact with patient to assess and evaluate symptoms and progress in treatment and Medication management  Supportive approach/coping skills Mood instability; continue to work with the Trileptal 600 mg BID Depression/compulsive behavior; continue the Zoloft 200 mg daily Impulsive behaviors: will increase the Topamax to 50 mg BID  Anxiety; will continue the Klonopin 1 mg TID Note; he is requesting to be D/C tomorrow. States that his BF is willing to  keep the medications and give them to him as prescribed Will use CBT/mindfulness he is going to participate in DBT   Caridad Silveira A 11/23/2015, 6:49 PM

## 2015-11-23 NOTE — Progress Notes (Signed)
Recreation Therapy Notes  Date: 12.14.2016  Time: 9:30am Location: [address] Group Room   Group Topic: Stress Management  Goal Area(s) Addresses:  Patient will actively participate in stress management techniques presented during session.   Behavioral Response: Appropriate   Intervention: Stress management techniques  Activity :  Deep Breathing and Guided Imagery. LRT provided instruction and demonstration on practice of Guided Imagery. Technique was coupled with deep breathing.   Education:  Stress Management, Discharge Planning.   Education Outcome: Acknowledges education  Clinical Observations/Feedback: Patient actively engaged in technique introduced, expressed no concerns and demonstrated ability to practice independently post d/c.   Sharmin Foulk L Aurorah Schlachter, LRT/CTRS        Harmani Neto L 11/23/2015 2:13 PM 

## 2015-11-24 DIAGNOSIS — F639 Impulse disorder, unspecified: Secondary | ICD-10-CM

## 2015-11-24 DIAGNOSIS — F3163 Bipolar disorder, current episode mixed, severe, without psychotic features: Secondary | ICD-10-CM | POA: Diagnosis present

## 2015-11-24 DIAGNOSIS — F429 Obsessive-compulsive disorder, unspecified: Secondary | ICD-10-CM

## 2015-11-24 MED ORDER — HYDROXYZINE HCL 25 MG PO TABS: 25.0000 mg | ORAL_TABLET | Freq: Four times a day (QID) | ORAL | Status: DC | PRN

## 2015-11-24 MED ORDER — TOPIRAMATE 50 MG PO TABS: 50.0000 mg | ORAL_TABLET | Freq: Two times a day (BID) | ORAL | Status: DC

## 2015-11-24 MED ORDER — OXCARBAZEPINE 600 MG PO TABS: 600.0000 mg | ORAL_TABLET | Freq: Two times a day (BID) | ORAL | Status: DC

## 2015-11-24 MED ORDER — SERTRALINE HCL 100 MG PO TABS: 200.0000 mg | ORAL_TABLET | Freq: Every day | ORAL | Status: DC

## 2015-11-24 MED ORDER — QUETIAPINE FUMARATE 100 MG PO TABS: 100.0000 mg | ORAL_TABLET | Freq: Every evening | ORAL | Status: DC | PRN

## 2015-11-24 MED ORDER — METOPROLOL SUCCINATE ER 25 MG PO TB24: 25.0000 mg | ORAL_TABLET | Freq: Every day | ORAL | Status: DC

## 2015-11-24 NOTE — Discharge Summary (Signed)
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 OCD (obsessive compulsive disorder) [F42.9] 11/22/2015  . Impulse control disorder [F63.9] 11/22/2015  . Borderline personality disorder [F60.3] 11/21/2015    Past Psychiatric History:  See above  Past Medical History:  Past Medical History  Diagnosis Date  . SVT (supraventricular tachycardia) (HCC)   . Anxiety   . OCD (obsessive compulsive disorder)   . Antisocial personality disorder     Past Surgical History  Procedure Laterality Date  . Ankle arthroplasty Bilateral    Family History: History reviewed. No pertinent family history. Family Psychiatric  History:  none Social History:  History  Alcohol Use  . Yes    Comment: occ     History  Drug Use  . Yes  . Special: Marijuana    Comment: 11/20/2015    Social History   Social History  . Marital Status: Single    Spouse Name: N/A  . Number of Children: N/A  . Years of Education: N/A   Social History Main Topics  . Smoking status: Heavy Tobacco Smoker    Types: Cigarettes  . Smokeless tobacco: Current User    Types: Snuff  . Alcohol Use: Yes     Comment: occ  . Drug Use: Yes    Special: Marijuana     Comment: 11/20/2015  . Sexual Activity: Yes   Other Topics Concern  . 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  He was treated with the following medications listed below.  Karl GinsJoseph Carroll was discharged with current medication and was instructed on how to take medications as prescribed; (details listed below under Medication List).  Medical problems were identified and treated as needed.  Home medications were restarted as appropriate.  Improvement was monitored by observation and Karl Carroll daily report of symptom reduction.  Emotional and mental status was monitored by daily self-inventory reports completed by Karl Carroll and clinical staff.         Karl GinsJoseph Carroll was evaluated by the treatment team for stability and plans for continued recovery upon discharge.  Karl Carroll motivation was an integral factor for scheduling further treatment.  Employment, transportation, bed availability, health status, family support, and any pending legal issues were also considered during his hospital stay.  He was offered further treatment options upon discharge including but not limited to Residential, Intensive Outpatient, and Outpatient treatment.  Karl GinsJoseph Carroll will follow up with the services as listed below under Follow Up Information.     Upon completion of this admission the Karl Carroll was both mentally and medically stable for discharge denying suicidal/homicidal ideation, auditory/visual/tactile hallucinations, delusional thoughts and paranoia.     Physical Findings: AIMS: Facial and Oral Movements Muscles of Facial Expression: None, normal Lips and Perioral Area: None, normal Jaw: None, normal Tongue: None, normal,Extremity Movements Upper (arms, wrists, hands, fingers): None, normal Lower (legs,  knees, ankles, toes): None, normal, Trunk Movements Neck, shoulders, hips: None, normal, Overall Severity Severity of abnormal movements (highest score from questions above): None, normal Incapacitation due to abnormal movements: None, normal Patient's awareness of abnormal movements (rate only  patient's report): No Awareness, Dental Status Current problems with teeth and/or dentures?: No Does patient usually wear dentures?: No  CIWA:    COWS:     Musculoskeletal: Strength & Muscle Tone: within normal limits Gait & Station: normal Patient leans: N/A  Psychiatric Specialty Exam:  SEE MD SRA Review of Systems  All other systems reviewed and are negative.   Blood pressure 126/78, pulse 80, temperature 97.8 F (36.6 C), temperature source Oral, resp. rate 16, height  (1.727 m), weight 106.142 kg (234 lb), SpO2 97 %.Body mass index is 35.59 kg/(m^2).  Have you used any form of tobacco in the last 30 days? (Cigarettes, Smokeless Tobacco, Cigars, and/or Pipes): Yes  Has this patient used any form of tobacco in the last 30 days? (Cigarettes, Smokeless Tobacco, Cigars, and/or Pipes) Yes, N/A  Metabolic Disorder Labs:  Lab Results  Component Value Date   HGBA1C  10/20/2010    5.2 (NOTE)                                                                       According to the ADA Clinical Practice Recommendations for 2011, when HbA1c is used as a screening test:   >=6.5%   Diagnostic of Diabetes Mellitus           (if abnormal result  is confirmed)  5.7-6.4%   Increased risk of developing Diabetes Mellitus  References:Diagnosis and Classification of Diabetes Mellitus,Diabetes Care,2011,34(Suppl 1):S62-S69 and Standards of Medical Care in         Diabetes - 2011,Diabetes Care,2011,34  (Suppl 1):S11-S61.   MPG 103 10/20/2010   No results found for: PROLACTIN Lab Results  Component Value Date   CHOL  10/20/2010    187        ATP III CLASSIFICATION:  <200     mg/dL   Desirable  161-096  mg/dL   Borderline High  >=045    mg/dL   High          TRIG 409* 10/20/2010   HDL 45 10/20/2010   CHOLHDL 4.2 10/20/2010   VLDL 36 10/20/2010   LDLCALC * 10/20/2010    106        Total Cholesterol/HDL:CHD Risk Coronary Heart Disease Risk Table                     Men   Women  1/2 Average  Risk   3.4   3.3  Average Risk       5.0   4.4  2 X Average Risk   9.6   7.1  3 X Average Risk  23.4   11.0        Use the calculated Patient Ratio above and the CHD Risk Table to determine the patient's CHD Risk.        ATP III CLASSIFICATION (LDL):  <100     mg/dL   Optimal  811-914  mg/dL   Near or Above  Optimal  130-159  mg/dL   Borderline  161-096  mg/dL   High  >045     mg/dL   Very High    See Psychiatric Specialty Exam and Suicide Risk Assessment completed by Attending Physician prior to discharge.  Discharge destination:  Home  Is patient on multiple antipsychotic therapies at discharge:  No   Has Patient had three or more failed trials of antipsychotic monotherapy by history:  No  Recommended Plan for Multiple Antipsychotic Therapies: NA  Discharge Instructions    Activity as tolerated - No restrictions    Complete by:  As directed      Diet general    Complete by:  As directed      Discharge instructions    Complete by:  As directed   Karl Carroll has been instructed to take medications as prescribed; and report adverse effects to outpatient provider.  Follow up with primary doctor for any medical issues and If symptoms recur report to nearest emergency or crisis hot line.            Medication List    STOP taking these medications        clonazePAM 0.5 MG tablet  Commonly known as:  KLONOPIN      TAKE these medications      Indication   hydrOXYzine 25 MG tablet  Commonly known as:  ATARAX/VISTARIL  Take 1 tablet (25 mg total) by mouth every 6 (six) hours as needed for anxiety.   Indication:  Anxiety Neurosis     metoprolol succinate 25 MG 24 hr tablet  Commonly known as:  TOPROL-XL  Take 1 tablet (25 mg total) by mouth daily.   Indication:  High Blood Pressure     oxcarbazepine 600 MG tablet  Commonly known as:  TRILEPTAL  Take 1 tablet (600 mg total) by mouth 2 (two) times daily.   Indication:  Manic-Depression      QUEtiapine 100 MG tablet  Commonly known as:  SEROQUEL  Take 1 tablet (100 mg total) by mouth at bedtime and may repeat dose one time if needed.   Indication:  mood stabilization     sertraline 100 MG tablet  Commonly known as:  ZOLOFT  Take 2 tablets (200 mg total) by mouth daily.   Indication:  Major Depressive Disorder     topiramate 50 MG tablet  Commonly known as:  TOPAMAX  Take 1 tablet (50 mg total) by mouth 2 (two) times daily.   Indication:  MOOD STABILIZATION           Follow-up Information    Follow up with [facility] On 12/06/2015.   Why:  Appt on this date from 10am-11am for anger management group.    Contact information:   [address] [place], [place] [address] Phone: [phone] Fax: [phone]      Follow up with [facility]  On 12/13/2015.   Why:  Appt on this date at 9:00AM for medication management.    Contact information:   [address] [place] [address] Phone: [phone] Fax: [phone]      Follow-up recommendations:  Activity:  as tol Diet:  as tol  Comments:  1.  Take all your medications as prescribed.              2.  Report any adverse side effects to outpatient provider.  3.  Patient instructed to not use alcohol or illegal drugs while on prescription medicines.            4.  In the event of worsening symptoms, instructed patient to call 911, the crisis hotline or go to nearest emergency room for evaluation of symptoms.  Signed: Velna Hatchet May Agustin AGNP-BC 11/24/2015, 12:28 PM  I personally assessed the patient and formulated the plan Madie Reno A. Dub Mikes, M.D.

## 2015-11-24 NOTE — Progress Notes (Signed)
D: Pt presents labile, intrusive, and sarcastic. Pt is visible and active within the milieu. Pt attended group. Pt chewed his scheduled klonopin. Pt was instructed no to do such. Pt made reference to us not allowing him to snort his klonopin in a line. Pt denied any suicidal ideation. Pt verbally contracts for safety.  A: Writer administered scheduled medications to pt, per MD orders. Continued support and availability as needed was extended to this pt. Staff continues to monitor pt with q4115min checks.  R: No adverse drug reactions noted. Pt receptive to treatment. Pt remains safe at this time.
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
## 2015-11-24 NOTE — Progress Notes (Signed)
Patient ID: Karl GinsJoseph Lajeunesse, male   DOB: 02/20/1991, 24 y.o.   MRN: [id number]   Pt currently presents with a flat affect and demanding behavior. Per self inventory, pt rates depression at a 0, hopelessness 0 and anxiety 4. Pt's daily goal is to "go home" and they intend to do so by "release me." Pt reports good sleep, a good appetite, normal  energy and good concentration. Pt did not attend group this morning, pt in bed asleep. Pt chews klonopin during med administration.   Pt provided with medications per providers orders. Pt's labs and vitals were monitored throughout the day. Pt supported emotionally and encouraged to express concerns and questions. Pt educated on medications.  Pt's safety ensured with 15 minute and environmental checks. Pt currently denies SI/HI and A/V hallucinations. Pt verbally agrees to seek staff if SI/HI or A/VH occurs and to consult with staff before acting on these thoughts. Pt asks writer to "slip me a klonopin" before I go. Pt told order must be in place for writer to administer medications. Pt to be discharged per MD. Will continue POC.

## 2015-11-24 NOTE — Progress Notes (Signed)
Patient ID: Emeline GinsJoseph Cavazos, male   DOB: 02/14/1991, 24 y.o.   MRN: [id number] Adult Psychoeducational Group Note  Date:  11/24/2015 Time: 09:15AM  Group Topic/Focus:  Making Healthy Choices:   The focus of this group is to help patients identify negative/unhealthy choices they were using prior to admission and identify positive/healthier coping strategies to replace them upon discharge.  Participation Level:  Did Not Attend  Participation Quality: n/a  Affect: n/a  Cognitive: n/a  Insight: n/a  Engagement in Group: n/a  Modes of Intervention:  Activity, Discussion, Education and Support  Additional Comments:  Pt in bed asleep.   Aurora Maskwyman, Hazyl Marseille E 11/24/2015, 10:22 AM

## 2015-11-24 NOTE — Progress Notes (Addendum)
Patient ID: Karl GinsJoseph Freestone, male   DOB: 11/11/1991, 24 y.o.   MRN: [id number]   Pt discharged home with his boyfriend. Pt was stable and appreciative at that time. All papers and prescriptions were given and valuables returned from locker and behind curtain. Verbal understanding expressed. Denies SI/HI and A/VH. Pt given opportunity to express concerns and ask questions. Security present.

## 2015-11-24 NOTE — Plan of Care (Signed)
Problem: Alteration in mood & ability to function due to Goal: STG-Pt will be introduced to the 12-step program of recovery (Patient will be introduced to the 12-step program of recovery and disease concept of addiction)  Outcome: Progressing Pt attended AA on 11/23/15.

## 2015-11-24 NOTE — Progress Notes (Signed)
  [facility] :  Will you be returning to the same living situation after discharge:  Yes,  home with boyfriend At discharge, do you have transportation home?: Yes,  boyfriend Do you have the ability to pay for your medications: Yes,  Medicaid  Release of information consent forms completed and in submitted to medical records by CSW.  Patient to Follow up at: Follow-up Information    Follow up with [facility] On 12/06/2015.   Why:  Appt on this date from 10am-11am for anger management group.    Contact information:   [address] [place], [place] [address] Phone: [phone] Fax: [phone]      Follow up with [facility]  On 12/13/2015.   Why:  Appt on this date at 9:00AM for medication management.    Contact information:   [address] [place], [place] [address] Phone: [phone] Fax: [phone]     Next level of care provider has access to [facility] Link:no  Patient denies SI/HI: Yes,  during group/self report.     Safety Planning and Suicide Prevention discussed: Yes,  SPE completed with pt's boyfriend. SPI pamphlet and mobile crisis information provided to pt.   Have you used any form of tobacco in the last 30 days? (Cigarettes, Smokeless Tobacco, Cigars, and/or Pipes): Yes  Has patient been referred to the Quitline?: Patient refused referral  Patient has been referred for addiction treatment: Yes-see above  Smart, Rylinn Linzy LCSW 11/24/2015, 9:43 AM

## 2015-11-24 NOTE — [facility] Suicide Risk Assessment (Addendum)
[facility]   Demographic Factors:  Male and Caucasian  Total Time spent with patient: 30 minutes  Musculoskeletal: Strength & Muscle Tone: within normal limits Gait & Station: normal Patient leans: N/A  Psychiatric Specialty Exam: Physical Exam  Review of Systems  Psychiatric/Behavioral: Positive for substance abuse. Negative for depression, suicidal ideas and hallucinations. The patient is not nervous/anxious.   All other systems reviewed and are negative.   Blood pressure 126/78, pulse 80, temperature 97.8 F (36.6 C), temperature source Oral, resp. rate 16, height 5\' 8"  (1.727 m), weight 106.142 kg (234 lb), SpO2 97 %.Body mass index is 35.59 kg/(m^2).  General Appearance: Casual  Eye Contact::  Fair  Speech:  Clear and Coherent409  Volume:  Normal  Mood:  Anxious  Affect:  Appropriate  Thought Process:  Goal Directed  Orientation:  Full (Time, Place, and Person)  Thought Content:  WDL  Suicidal Thoughts:  No  Homicidal Thoughts:  No  Memory:  Immediate;   Fair Recent;   Fair Remote;   Fair  Judgement:  Fair  Insight:  Fair  Psychomotor Activity:  Normal  Concentration:  Fair  Recall:  Fis[employer]Fair  Fund of Knowledge:Fair  Language: Fair  Akathisia:  No  Handed:  Right  AIMS (if indicated):     Assets:  Social Support  Sleep:  Number of Hours: 7.25  Cognition: WNL  ADL's:  Intact   Have you used any form of tobacco in the last 30 days? (Cigarettes, Smokeless Tobacco, Cigars, and/or Pipes): Yes  Has this patient used any form of tobacco in the last 30 days? (Cigarettes, Smokeless Tobacco, Cigars, and/or Pipes) Yes, Prescription provided with nicotine patch  Mental Status Per Nursing Assessment::   On Admission:     Current Mental Status by Physician: pt denies SI/HI/AH/VH  Loss Factors: NA  Historical Factors: Impulsivity  Risk Reduction Factors:   Positive social support and Positive therapeutic relationship  Continued Clinical  Symptoms:  Alcohol/Substance Abuse/Dependencies Previous Psychiatric Diagnoses and Treatments  Cognitive Features That Contribute To Risk:  Polarized thinking    Suicide Risk:  Minimal: No identifiable suicidal ideation.  Patients presenting with no risk factors but with morbid ruminations; may be classified as minimal risk based on the severity of the depressive symptoms  Principal Problem: Severe mixed bipolar 1 disorder without psychosis (HCC)- IMPROVED Discharge Diagnoses:  Patient Active Problem List   Diagnosis Date Noted  . 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 OCD (obsessive compulsive disorder) [F42.9] 11/22/2015  . Impulse control disorder [F63.9] 11/22/2015  . Borderline personality disorder [F60.3] 11/21/2015    Follow-up Information    Follow up with [facility] On 12/06/2015.   Why:  Appt on this date from 10am-11am for anger management group.    Contact information:   [address] [place], [place] [address] Phone: [phone] Fax: [phone]      Follow up with [facility]  On 12/13/2015.   Why:  Appt on this date at 9:00AM for medication management.    Contact information:   [address] [place], [place] [address] Phone: [phone] Fax: [phone]      Plan Of Care/Follow-up recommendations:  Activity:  No restrictions Diet:  regular Tests:  as needed Other:  follow up with after care  Is patient on multiple antipsychotic therapies at discharge:  No   Has Patient had three or more failed trials of antipsychotic monotherapy by history:  No  Recommended Plan for  Multiple Antipsychotic Therapies: NA    Lulla Linville MD 11/24/2015, 9:51 AM

## 2015-11-25 ENCOUNTER — Encounter (HOSPITAL_COMMUNITY): Payer: Self-pay

## 2015-11-25 ENCOUNTER — Emergency Department (HOSPITAL_COMMUNITY)
Admission: EM | Admit: 2015-11-25 | Discharge: 2015-11-25 | Disposition: A | Discharge status: Home / Self Care | Payer: Medicaid Other | Attending: Emergency Medicine | Admitting: Emergency Medicine

## 2015-11-25 DIAGNOSIS — L02411 Cutaneous abscess of right axilla: Secondary | ICD-10-CM | POA: Insufficient documentation

## 2015-11-25 DIAGNOSIS — Z8679 Personal history of other diseases of the circulatory system: Secondary | ICD-10-CM | POA: Insufficient documentation

## 2015-11-25 DIAGNOSIS — F17210 Nicotine dependence, cigarettes, uncomplicated: Secondary | ICD-10-CM | POA: Diagnosis not present

## 2015-11-25 DIAGNOSIS — Z79899 Other long term (current) drug therapy: Secondary | ICD-10-CM | POA: Insufficient documentation

## 2015-11-25 DIAGNOSIS — F419 Anxiety disorder, unspecified: Secondary | ICD-10-CM | POA: Diagnosis not present

## 2015-11-25 DIAGNOSIS — F429 Obsessive-compulsive disorder, unspecified: Secondary | ICD-10-CM | POA: Diagnosis not present

## 2015-11-25 MED ORDER — ACETAMINOPHEN 325 MG PO TABS
650.0000 mg | ORAL_TABLET | Freq: Once | ORAL | Status: AC
  Administered 2015-11-25: 650 mg via ORAL
  Filled 2015-11-25: qty 2

## 2015-11-25 MED ORDER — SULFAMETHOXAZOLE-TRIMETHOPRIM 800-160 MG PO TABS: 1.0000 | ORAL_TABLET | Freq: Two times a day (BID) | ORAL | Status: DC

## 2015-11-25 NOTE — Discharge Instructions (Signed)
Followup with your doctor or an urgent care in order to remove your packing in 48-72 hours. You may return to the emergency department if you have  a fever that persists greater than 101 or your abscess appears to become infected (growing surrounding redness and warmth).  Abscess An abscess (boil or furuncle) is an infected area that contains a collection of pus.  SYMPTOMS Signs and symptoms of an abscess include pain, tenderness, redness, or hardness. You may feel a moveable soft area under your skin. An abscess can occur anywhere in the body.  TREATMENT  A surgical cut (incision) may be made over your abscess to drain the pus. Gauze may be packed into the space or a drain may be looped through the abscess cavity (pocket). This provides a drain that will allow the cavity to heal from the inside outwards. The abscess may be painful for a few days, but should feel much better if it was drained.  Your abscess, if seen early, may not have localized and may not have been drained. If not, another appointment may be required if it does not get better on its own or with medications. HOME CARE INSTRUCTIONS   Only take over-the-counter or prescription medicines for pain, discomfort, or fever as directed by your caregiver.   Take your antibiotics as directed if they were prescribed. Finish them even if you start to feel better.   Keep the skin and clothes clean around your abscess.   If the abscess was drained, you will need to use gauze dressing to collect any draining pus. Dressings will typically need to be changed 3 or more times a day.   The infection may spread by skin contact with others. Avoid skin contact as much as possible.   Practice good hygiene. This includes regular hand washing, cover any draining skin lesions, and do not share personal care items.   If you participate in sports, do not share athletic equipment, towels, whirlpools, or personal care items. Shower after every practice or  tournament.   If a draining area cannot be adequately covered:   Do not participate in sports.   Children should not participate in day care until the wound has healed or drainage stops.   If your caregiver has given you a follow-up appointment, it is very important to keep that appointment. Not keeping the appointment could result in a much worse infection, chronic or permanent injury, pain, and disability. If there is any problem keeping the appointment, you must call back to this facility for assistance.  SEEK MEDICAL CARE IF:   You develop increased pain, swelling, redness, drainage, or bleeding in the wound site.   You develop signs of generalized infection including muscle aches, chills, fever, or a general ill feeling.   You have an oral temperature above 102 F (38.9 C).  MAKE SURE YOU:   Understand these instructions.   Will watch your condition.   Will get help right away if you are not doing well or get worse.  Document Released: 09/05/2005 Document Revised: 08/08/2011 Document Reviewed: 06/29/2008 [facility] Patient Information 2012 [place].

## 2015-11-25 NOTE — ED Provider Notes (Signed)
CSN: [id number]     Arrival date & time 11/25/15  1221 History  By signing my name below, I, Soijett Blue, attest that this documentation has been prepared under the direction and in the presence of Elizabeth Sauer, PA-C Electronically Signed: Soijett Blue, ED Scribe. 11/25/2015. 1:53 PM.    Chief Complaint  Patient presents with  . boil       The history is provided by the patient. No language interpreter was used.    Karl Carroll is a 24 y.o. male who presents to the Emergency Department complaining of abscess to his right axilla onset 4 days. He notes that he tried to I&D the area but he was not successful in his attempt. He states that he tried to lance the area by heating up a safety pin and putting it in hydrogen peroxide and popping the area. He states that his last tetanus was 2 years ago.  He states that he is having associated symptoms of drainage, redness, and diarrhea. He states that he has tried I&D without medications for the relief of his symptoms. He denies fever, chills, CP, and any other symptoms. Denies allergies to medications.   Past Medical History  Diagnosis Date  . SVT (supraventricular tachycardia) (HCC)   . Anxiety   . OCD (obsessive compulsive disorder)   . Antisocial personality disorder    Past Surgical History  Procedure Laterality Date  . Ankle arthroplasty Bilateral    No family history on file. Social History  Substance Use Topics  . Smoking status: Heavy Tobacco Smoker    Types: Cigarettes  . Smokeless tobacco: Current User    Types: Snuff  . Alcohol Use: Yes     Comment: occ    Review of Systems  Constitutional: Negative for fever and chills.  Cardiovascular: Negative for chest pain.  Skin: Positive for color change.       Abscess to right axilla      Allergies  Effexor; Haldol; Darvon; Prozac; Trinitrophenol; Ultram; Wellbutrin; Celexa; and Naprosyn  Home Medications   Prior to Admission medications   Medication Sig Start Date  End Date Taking? Authorizing Provider  hydrOXYzine (ATARAX/VISTARIL) 25 MG tablet Take 1 tablet (25 mg total) by mouth every 6 (six) hours as needed for anxiety. 11/24/15   Oneta Rack, NP  metoprolol succinate (TOPROL-XL) 25 MG 24 hr tablet Take 1 tablet (25 mg total) by mouth daily. 11/24/15   Oneta Rack, NP  Oxcarbazepine (TRILEPTAL) 600 MG tablet Take 1 tablet (600 mg total) by mouth 2 (two) times daily. 11/24/15   Oneta Rack, NP  QUEtiapine (SEROQUEL) 100 MG tablet Take 1 tablet (100 mg total) by mouth at bedtime and may repeat dose one time if needed. 11/24/15   Oneta Rack, NP  sertraline (ZOLOFT) 100 MG tablet Take 2 tablets (200 mg total) by mouth daily. 11/24/15   Oneta Rack, NP  sulfamethoxazole-trimethoprim (BACTRIM DS,SEPTRA DS) 800-160 MG tablet Take 1 tablet by mouth 2 (two) times daily. 11/25/15 12/02/15  Chase Picket Ward, PA-C  topiramate (TOPAMAX) 50 MG tablet Take 1 tablet (50 mg total) by mouth 2 (two) times daily. 11/24/15   Oneta Rack, NP   BP 131/83 mmHg  Pulse 102  Temp(Src) 98.2 F (36.8 C) (Oral)  Resp 20  SpO2 96% Physical Exam  Constitutional: He is oriented to person, place, and time. He appears well-developed and well-nourished. No distress.  HENT:  Head: Normocephalic and atraumatic.  Eyes: EOM are normal.  Neck: Neck supple.  Cardiovascular: Normal rate.   Pulmonary/Chest: Effort normal. No respiratory distress.  Abdominal: Soft. There is no tenderness.  Musculoskeletal: Normal range of motion.  Neurological: He is alert and oriented to person, place, and time.  Skin: Skin is warm and dry.  3x3 abscess of right axilla with 1cm of erythema surrounding area. Prior incision pinpoint noted from when pt attempted drainage with safety pin last night.   Psychiatric: He has a normal mood and affect. His behavior is normal.  Nursing note and vitals reviewed.   ED Course  Procedures (including critical care time) DIAGNOSTIC  STUDIES: Oxygen Saturation is 96% on RA, nl by my interpretation.    COORDINATION OF CARE: 1:36 PM Discussed treatment plan with pt at bedside which includes I&D and pt agreed to plan.  1:37 PM-Pt was offered anesthesia for his I&D procedure, to which he declined at this time.   INCISION AND DRAINAGE PROCEDURE NOTE: Patient identification was confirmed and verbal consent was obtained. This procedure was performed by Elizabeth SauerJaime Ward, PA-C at 1:39 PM. Site: Right axilla Sterile procedures observed: Yes Needle size: N/A Anesthetic used (type and amt): N/A Blade size: 11 Drainage: Moderate Complexity: Complex Packing used: Yes Site anesthetized, incision made over site, wound drained and explored loculations, rinsed with copious amounts of normal saline, wound packed with sterile gauze, covered with dry, sterile dressing.  Pt tolerated procedure well without complications.  Instructions for care discussed verbally and pt provided with additional written instructions for homecare and f/u.   Labs Review Labs Reviewed - No data to display  Imaging Review No results found.    EKG Interpretation None      MDM   Final diagnoses:  Abscess of axilla, right  Karl Carroll presents with right axilla abscess. Patient denied lidocaine anesthetic prior to lancing abscess.   A&P: Right axilla abscess  - I&D performed in ED  - Bactrim  - home care instructions, return precautions, and follow up recommendations given; all questions answered.   I personally performed the services described in this documentation, which was scribed in my presence. The recorded information has been reviewed and is accurate.   Chase PicketJaime Pilcher Ward, PA-C 11/25/15 1407  Laurence Spatesachel Morgan Little, MD 11/27/15 [phone]

## 2015-11-25 NOTE — ED Notes (Signed)
Patient c/o right under arm pain from boil that began Tuesday night.  Patient tried to take care of it himself and was unsuccessful.  Rates pain 8/10

## 2015-11-28 ENCOUNTER — Emergency Department (HOSPITAL_COMMUNITY)
Admission: EM | Admit: 2015-11-28 | Discharge: 2015-11-28 | Disposition: A | Discharge status: Other Acute Inpatient Hospital | Payer: Medicaid Other | Attending: Emergency Medicine | Admitting: Emergency Medicine

## 2015-11-28 ENCOUNTER — Encounter (HOSPITAL_COMMUNITY): Payer: Self-pay

## 2015-11-28 ENCOUNTER — Inpatient Hospital Stay (HOSPITAL_COMMUNITY)
Admission: AD | Admit: 2015-11-28 | Discharge: 2015-11-28 | DRG: 885 | Disposition: A | Discharge status: Home / Self Care | Payer: Medicaid Other | Source: Intra-hospital | Attending: Psychiatry | Admitting: Psychiatry

## 2015-11-28 DIAGNOSIS — Z008 Encounter for other general examination: Secondary | ICD-10-CM | POA: Diagnosis present

## 2015-11-28 DIAGNOSIS — F17210 Nicotine dependence, cigarettes, uncomplicated: Secondary | ICD-10-CM | POA: Diagnosis present

## 2015-11-28 DIAGNOSIS — F429 Obsessive-compulsive disorder, unspecified: Secondary | ICD-10-CM | POA: Insufficient documentation

## 2015-11-28 DIAGNOSIS — Z7289 Other problems related to lifestyle: Secondary | ICD-10-CM

## 2015-11-28 DIAGNOSIS — F1210 Cannabis abuse, uncomplicated: Secondary | ICD-10-CM | POA: Insufficient documentation

## 2015-11-28 DIAGNOSIS — Z79899 Other long term (current) drug therapy: Secondary | ICD-10-CM | POA: Insufficient documentation

## 2015-11-28 DIAGNOSIS — Y9389 Activity, other specified: Secondary | ICD-10-CM | POA: Insufficient documentation

## 2015-11-28 DIAGNOSIS — X76XXXA Intentional self-harm by smoke, fire and flames, initial encounter: Secondary | ICD-10-CM | POA: Insufficient documentation

## 2015-11-28 DIAGNOSIS — F639 Impulse disorder, unspecified: Secondary | ICD-10-CM | POA: Diagnosis not present

## 2015-11-28 DIAGNOSIS — T22012A Burn of unspecified degree of left forearm, initial encounter: Secondary | ICD-10-CM | POA: Diagnosis not present

## 2015-11-28 DIAGNOSIS — F419 Anxiety disorder, unspecified: Secondary | ICD-10-CM | POA: Diagnosis not present

## 2015-11-28 DIAGNOSIS — F319 Bipolar disorder, unspecified: Principal | ICD-10-CM | POA: Diagnosis present

## 2015-11-28 DIAGNOSIS — F1310 Sedative, hypnotic or anxiolytic abuse, uncomplicated: Secondary | ICD-10-CM | POA: Insufficient documentation

## 2015-11-28 DIAGNOSIS — Y9289 Other specified places as the place of occurrence of the external cause: Secondary | ICD-10-CM | POA: Insufficient documentation

## 2015-11-28 DIAGNOSIS — R45851 Suicidal ideations: Secondary | ICD-10-CM | POA: Diagnosis present

## 2015-11-28 DIAGNOSIS — Z8679 Personal history of other diseases of the circulatory system: Secondary | ICD-10-CM | POA: Diagnosis not present

## 2015-11-28 DIAGNOSIS — Y998 Other external cause status: Secondary | ICD-10-CM | POA: Insufficient documentation

## 2015-11-28 LAB — COMPREHENSIVE METABOLIC PANEL
ALBUMIN: 4.1 g/dL (ref 3.5–5.0)
ALK PHOS: 67 U/L (ref 38–126)
ALT: 24 U/L (ref 17–63)
AST: 22 U/L (ref 15–41)
Anion gap: 10 (ref 5–15)
BILIRUBIN TOTAL: 0.4 mg/dL (ref 0.3–1.2)
BUN: 8 mg/dL (ref 6–20)
CALCIUM: 9.2 mg/dL (ref 8.9–10.3)
CO2: 21 mmol/L — AB (ref 22–32)
CREATININE: 0.84 mg/dL (ref 0.61–1.24)
Chloride: 109 mmol/L (ref 101–111)
GFR calc Af Amer: >60 mL/min (ref >60)
GFR calc non Af Amer: >60 mL/min (ref >60)
GLUCOSE: 92 mg/dL (ref 65–99)
Potassium: 3.7 mmol/L (ref 3.5–5.1)
SODIUM: 140 mmol/L (ref 135–145)
Total Protein: 7.4 g/dL (ref 6.5–8.1)

## 2015-11-28 LAB — CBC WITH DIFFERENTIAL/PLATELET
Basophils Absolute: 0 10*3/uL (ref 0.0–0.1)
Basophils Relative: 0 %
EOS ABS: 0.2 10*3/uL (ref 0.0–0.7)
EOS PCT: 2 %
HCT: 43.4 % (ref 39.0–52.0)
HEMOGLOBIN: 14.4 g/dL (ref 13.0–17.0)
LYMPHS ABS: 1.7 10*3/uL (ref 0.7–4.0)
Lymphocytes Relative: 23 %
MCH: 30.5 pg (ref 26.0–34.0)
MCHC: 33.2 g/dL (ref 30.0–36.0)
MCV: 91.9 fL (ref 78.0–100.0)
MONO ABS: 0.9 10*3/uL (ref 0.1–1.0)
MONOS PCT: 12 %
NEUTROS PCT: 63 %
Neutro Abs: 4.6 10*3/uL (ref 1.7–7.7)
Platelets: 221 10*3/uL (ref 150–400)
RBC: 4.72 MIL/uL (ref 4.22–5.81)
RDW: 13.1 % (ref 11.5–15.5)
WBC: 7.4 10*3/uL (ref 4.0–10.5)

## 2015-11-28 LAB — RAPID URINE DRUG SCREEN, HOSP PERFORMED
Amphetamines: NOT DETECTED
Barbiturates: NOT DETECTED
Benzodiazepines: POSITIVE — AB
Cocaine: NOT DETECTED
OPIATES: NOT DETECTED
TETRAHYDROCANNABINOL: POSITIVE — AB

## 2015-11-28 LAB — ETHANOL: Alcohol, Ethyl (B): <5 mg/dL (ref <5)

## 2015-11-28 MED ORDER — LORAZEPAM 1 MG PO TABS
0.0000 mg | ORAL_TABLET | Freq: Two times a day (BID) | ORAL | Status: DC
  Administered 2015-11-28: 2 mg via ORAL
  Filled 2015-11-28: qty 2

## 2015-11-28 MED ORDER — QUETIAPINE FUMARATE 100 MG PO TABS
ORAL_TABLET | ORAL | Status: AC
  Filled 2015-11-28: qty 1

## 2015-11-28 MED ORDER — NICOTINE POLACRILEX 2 MG MT GUM
2.0000 mg | CHEWING_GUM | OROMUCOSAL | Status: DC | PRN
  Administered 2015-11-28: 2 mg via ORAL

## 2015-11-28 MED ORDER — ZIPRASIDONE MESYLATE 20 MG IM SOLR: 20.0000 mg | INTRAMUSCULAR | Status: DC | PRN

## 2015-11-28 MED ORDER — OXCARBAZEPINE 300 MG PO TABS
600.0000 mg | ORAL_TABLET | Freq: Two times a day (BID) | ORAL | Status: DC
  Administered 2015-11-28: 600 mg via ORAL
  Filled 2015-11-28 (×6): qty 2

## 2015-11-28 MED ORDER — VITAMIN B-1 100 MG PO TABS: 100.0000 mg | ORAL_TABLET | Freq: Every day | ORAL | Status: DC

## 2015-11-28 MED ORDER — HYDROXYZINE HCL 25 MG PO TABS: 25.0000 mg | ORAL_TABLET | Freq: Four times a day (QID) | ORAL | Status: DC | PRN

## 2015-11-28 MED ORDER — METOPROLOL SUCCINATE ER 25 MG PO TB24
25.0000 mg | ORAL_TABLET | Freq: Every day | ORAL | Status: DC
  Administered 2015-11-28: 25 mg via ORAL
  Filled 2015-11-28 (×4): qty 1

## 2015-11-28 MED ORDER — TOPIRAMATE 25 MG PO TABS
50.0000 mg | ORAL_TABLET | Freq: Two times a day (BID) | ORAL | Status: DC
  Administered 2015-11-28: 50 mg via ORAL
  Filled 2015-11-28 (×6): qty 2

## 2015-11-28 MED ORDER — ZOLPIDEM TARTRATE 5 MG PO TABS: 5.0000 mg | ORAL_TABLET | Freq: Every evening | ORAL | Status: DC | PRN

## 2015-11-28 MED ORDER — MAGNESIUM HYDROXIDE 400 MG/5ML PO SUSP
30.0000 mL | Freq: Every day | ORAL | Status: DC | PRN
Start: 2015-11-28 — End: 2015-11-28

## 2015-11-28 MED ORDER — LORAZEPAM 1 MG PO TABS
1.0000 mg | ORAL_TABLET | Freq: Three times a day (TID) | ORAL | Status: DC | PRN
  Administered 2015-11-28: 1 mg via ORAL
  Filled 2015-11-28: qty 1

## 2015-11-28 MED ORDER — ONDANSETRON HCL 4 MG PO TABS: 4.0000 mg | ORAL_TABLET | Freq: Three times a day (TID) | ORAL | Status: DC | PRN

## 2015-11-28 MED ORDER — QUETIAPINE FUMARATE 100 MG PO TABS
100.0000 mg | ORAL_TABLET | Freq: Every evening | ORAL | Status: DC | PRN
  Filled 2015-11-28 (×4): qty 1

## 2015-11-28 MED ORDER — LORAZEPAM 1 MG PO TABS: 0.0000 mg | ORAL_TABLET | Freq: Four times a day (QID) | ORAL | Status: DC

## 2015-11-28 MED ORDER — SULFAMETHOXAZOLE-TRIMETHOPRIM 800-160 MG PO TABS
1.0000 | ORAL_TABLET | Freq: Two times a day (BID) | ORAL | Status: DC
  Administered 2015-11-28: 1 via ORAL
  Filled 2015-11-28 (×4): qty 1
  Filled 2015-11-28 (×2): qty 20
  Filled 2015-11-28: qty 1

## 2015-11-28 MED ORDER — METOPROLOL SUCCINATE ER 25 MG PO TB24: 25.0000 mg | ORAL_TABLET | Freq: Every day | ORAL | Status: AC

## 2015-11-28 MED ORDER — NICOTINE 21 MG/24HR TD PT24
21.0000 mg | MEDICATED_PATCH | Freq: Every day | TRANSDERMAL | Status: DC
  Filled 2015-11-28 (×4): qty 1

## 2015-11-28 MED ORDER — TOPIRAMATE 50 MG PO TABS: 50.0000 mg | ORAL_TABLET | Freq: Two times a day (BID) | ORAL | Status: DC

## 2015-11-28 MED ORDER — ACETAMINOPHEN 325 MG PO TABS: 650.0000 mg | ORAL_TABLET | ORAL | Status: DC | PRN

## 2015-11-28 MED ORDER — SULFAMETHOXAZOLE-TRIMETHOPRIM 800-160 MG PO TABS: 1.0000 | ORAL_TABLET | Freq: Two times a day (BID) | ORAL | Status: AC

## 2015-11-28 MED ORDER — LORAZEPAM 1 MG PO TABS: 1.0000 mg | ORAL_TABLET | Freq: Three times a day (TID) | ORAL | Status: DC | PRN

## 2015-11-28 MED ORDER — THIAMINE HCL 100 MG/ML IJ SOLN: 100.0000 mg | Freq: Every day | INTRAMUSCULAR | Status: DC

## 2015-11-28 MED ORDER — QUETIAPINE FUMARATE 100 MG PO TABS
100.0000 mg | ORAL_TABLET | Freq: Once | ORAL | Status: AC
  Administered 2015-11-28: 100 mg via ORAL
  Filled 2015-11-28: qty 1

## 2015-11-28 MED ORDER — ACETAMINOPHEN 325 MG PO TABS: 650.0000 mg | ORAL_TABLET | Freq: Four times a day (QID) | ORAL | Status: DC | PRN

## 2015-11-28 MED ORDER — QUETIAPINE FUMARATE 100 MG PO TABS: 100.0000 mg | ORAL_TABLET | Freq: Every evening | ORAL | Status: DC | PRN

## 2015-11-28 MED ORDER — NICOTINE 21 MG/24HR TD PT24: 21.0000 mg | MEDICATED_PATCH | Freq: Every day | TRANSDERMAL | Status: DC

## 2015-11-28 MED ORDER — LORAZEPAM 2 MG/ML IJ SOLN
0.0000 mg | Freq: Four times a day (QID) | INTRAMUSCULAR | Status: DC
Start: 2015-11-28 — End: 2015-11-28

## 2015-11-28 MED ORDER — ALUM & MAG HYDROXIDE-SIMETH 200-200-20 MG/5ML PO SUSP: 30.0000 mL | ORAL | Status: DC | PRN

## 2015-11-28 MED ORDER — LORAZEPAM 2 MG/ML IJ SOLN: 0.0000 mg | Freq: Two times a day (BID) | INTRAMUSCULAR | Status: DC

## 2015-11-28 MED ORDER — OXCARBAZEPINE 600 MG PO TABS: 600.0000 mg | ORAL_TABLET | Freq: Two times a day (BID) | ORAL | Status: AC
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
## 2015-11-28 NOTE — Progress Notes (Signed)
Patient admitted voluntarily c/o increased anxiety and agitation. He reports that he was [facility] last Thursday and has not been compliant with his medications because he can't afford them. He reports that his insurance doesn't cover prescriptions. He reports having increased self multilating thoughts and has a burn on his left arm that he reports recently burning it more since discharge. Patient reports that he had a abcess lanced 2 days ago at the ED but never got the prescription for his antibiotic filled because he could not afford it. Write assessed the right under arm and observed clear purulent  drainage, tender to touch and slightly swollen. He currently denies si/hi/a/v hallucinations. He reports no ETOH or drug use since last discharge but reports that his mother gave him some valium on Saturday to with his benzo withdrawals. Patient oriented to unit, snacks and medication will be administered once orders received.Safety maintained on unit with 15 min checks.

## 2015-11-28 NOTE — Progress Notes (Signed)
Discharge Note:  Patient discharged with bus ticket.  Patient denied SI and HI.  Denied A/V hallucinations.  Denied pain.  Suicide prevention information given and discussed with patient who stated he understood and had no questions.  Patient stated he received all his belongings, clothing, belt, boots, pack, cards, lighters, Consulting civil engineercharger and phone, e cig charger, coat, etc.  Patient stated he appreciated all assistance received from [facility] staff.
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
## 2015-11-28 NOTE — [facility] Suicide Risk Assessment (Signed)
[facility] INPATIENT:  Family/Significant Other Suicide Prevention Education  Suicide Prevention Education:  Patient Refusal for Family/Significant Other Suicide Prevention Education: The patient Karl Carroll has refused to provide written consent for family/significant other to be provided Family/Significant Other Suicide Prevention Education during admission and/or prior to discharge.  Physician notified.  Karl Carroll, [name] 11/28/2015, 2:34 PM
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
## 2015-11-28 NOTE — Tx Team (Signed)
Initial Interdisciplinary Treatment Plan   PATIENT STRESSORS: Medication change or noncompliance   PATIENT STRENGTHS: Average or above average intelligence Supportive family/friends   PROBLEM LIST: Problem List/Patient Goals Date to be addressed Date deferred Reason deferred Estimated date of resolution  Medication non compliance 11/28/15     Anxiety      Agitation      SI      Self mutilating thoughts and behaviors                               DISCHARGE CRITERIA:  Adequate post-discharge living arrangements  PRELIMINARY DISCHARGE PLAN: Return to previous living arrangement  PATIENT/FAMIILY INVOLVEMENT: This treatment plan has been presented to and reviewed with the patient, Karl Carroll, and/or family member.  The patient and family have been given the opportunity to ask questions and make suggestions.  Karl Carroll, Karl Carroll 11/28/2015, 4:12 AM

## 2015-11-28 NOTE — [facility] Counselor (Signed)
PSA not completed as patient discharged prior to 72 hours.   Tanzania Basham, MSW, LCSWA Clinical Social Worker [facility] [phone]  

## 2015-11-28 NOTE — [facility] Group Notes (Signed)
[facility] LCSW Group Therapy 11/28/2015  1:15 PM   Type of Therapy: Group Therapy  Participation Level: Did Not Attend. Patient invited to participate but declined.   Samuella BruinKristin Somalia Segler, MSW, [employer]CSWA Clinical Social Worker [facility] [phone]
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
## 2015-11-28 NOTE — Progress Notes (Addendum)
D:  Patient refused to fill out self inventory sheet.  Denied SI and HI this morning, contracts for safety.  Denied A/V hallucinations.    A:  Medications administered this morning per MD orders. R:  Safety maintained with 15 minute checks. Patient became very upset this morning when he demanded klonipin.  Patient was given ativan per MD orders.  Patient threatened to kick window/doors.  NP and MD informed.   Verbal abuse at NP.  Dressing changed under R armpit, boil had been lanced.  Small amount of drainage and new dry dressing applied under R armpit.  NP aware of boil that had been lanced.  MD talked with patient.  Patient will be discharged today.

## 2015-11-28 NOTE — [facility] Group Notes (Signed)
[facility] LCSW Aftercare Discharge Planning Group Note  11/28/2015  8:45 AM  Participation Quality: Did Not Attend. Patient invited to participate but declined.  Karl Carroll, MSW, LCSWA Clinical Social Worker [facility] [phone]   

## 2015-11-28 NOTE — ED Notes (Signed)
[facility] with Dr Dub MikesLugo as the accepting Dr.
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
## 2015-11-28 NOTE — Progress Notes (Signed)
  [facility] :  Will you be returning to the same living situation after discharge:  Yes,  patient plans to return to previous living situation At discharge, do you have transportation home?: Yes,  patient provided with bus pass Do you have the ability to pay for your medications: Yes,  patient will be provided with prescriptions at discharge  Release of information consent forms completed and in the chart;  Patient's signature needed at discharge.  Patient to Follow up at: Follow-up Information    Follow up with [facility] On 12/06/2015.   Why:  Appt on this date from 10am-11am for anger management group   Contact information:   [address] [place], [place] [address] Phone: [phone] Fax: [phone]       [facility] On 12/13/2015.   Why:  Appt on this date at 9:00AM for medication management.    Contact information:   [address] [place], [place] [address] Phone: [phone] Fax: [phone]       Next level of care provider has access to [facility] Link:no  Safety Planning and Suicide Prevention discussed: Yes,  with patient  Have you used any form of tobacco in the last 30 days? (Cigarettes, Smokeless Tobacco, Cigars, and/or Pipes): Yes  Has patient been referred to the Quitline?: Patient refused referral  Patient has been referred for addiction treatment: Yes- outpatient  Samrat Hayward, [place] CarboKristin L 11/28/2015, 3:29 PM
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
## 2015-12-01 ENCOUNTER — Emergency Department (HOSPITAL_COMMUNITY)
Admission: EM | Admit: 2015-12-01 | Discharge: 2015-12-01 | Disposition: A | Discharge status: Home / Self Care | Payer: Medicaid Other | Attending: Emergency Medicine | Admitting: Emergency Medicine

## 2015-12-01 ENCOUNTER — Encounter (HOSPITAL_COMMUNITY): Payer: Self-pay | Admitting: Emergency Medicine

## 2015-12-01 DIAGNOSIS — F1210 Cannabis abuse, uncomplicated: Secondary | ICD-10-CM | POA: Diagnosis not present

## 2015-12-01 DIAGNOSIS — F319 Bipolar disorder, unspecified: Secondary | ICD-10-CM | POA: Insufficient documentation

## 2015-12-01 DIAGNOSIS — Z79899 Other long term (current) drug therapy: Secondary | ICD-10-CM | POA: Insufficient documentation

## 2015-12-01 DIAGNOSIS — Z792 Long term (current) use of antibiotics: Secondary | ICD-10-CM | POA: Diagnosis not present

## 2015-12-01 DIAGNOSIS — F429 Obsessive-compulsive disorder, unspecified: Secondary | ICD-10-CM | POA: Insufficient documentation

## 2015-12-01 DIAGNOSIS — Z008 Encounter for other general examination: Secondary | ICD-10-CM | POA: Diagnosis present

## 2015-12-01 DIAGNOSIS — F17210 Nicotine dependence, cigarettes, uncomplicated: Secondary | ICD-10-CM | POA: Insufficient documentation

## 2015-12-01 DIAGNOSIS — F1310 Sedative, hypnotic or anxiolytic abuse, uncomplicated: Secondary | ICD-10-CM | POA: Insufficient documentation

## 2015-12-01 DIAGNOSIS — Z8679 Personal history of other diseases of the circulatory system: Secondary | ICD-10-CM | POA: Diagnosis not present

## 2015-12-01 DIAGNOSIS — F419 Anxiety disorder, unspecified: Secondary | ICD-10-CM | POA: Insufficient documentation

## 2015-12-01 LAB — COMPREHENSIVE METABOLIC PANEL
ALBUMIN: 4.3 g/dL (ref 3.5–5.0)
ALK PHOS: 69 U/L (ref 38–126)
ALT: 21 U/L (ref 17–63)
ANION GAP: 10 (ref 5–15)
AST: 25 U/L (ref 15–41)
BILIRUBIN TOTAL: 0.4 mg/dL (ref 0.3–1.2)
BUN: 11 mg/dL (ref 6–20)
CALCIUM: 9.7 mg/dL (ref 8.9–10.3)
CO2: 27 mmol/L (ref 22–32)
CREATININE: 1.04 mg/dL (ref 0.61–1.24)
Chloride: 102 mmol/L (ref 101–111)
GFR calc Af Amer: >60 mL/min (ref >60)
GFR calc non Af Amer: >60 mL/min (ref >60)
GLUCOSE: 115 mg/dL — AB (ref 65–99)
Potassium: 3.8 mmol/L (ref 3.5–5.1)
Sodium: 139 mmol/L (ref 135–145)
TOTAL PROTEIN: 7.8 g/dL (ref 6.5–8.1)

## 2015-12-01 LAB — RAPID URINE DRUG SCREEN, HOSP PERFORMED
Amphetamines: NOT DETECTED
Barbiturates: NOT DETECTED
Benzodiazepines: POSITIVE — AB
COCAINE: NOT DETECTED
Opiates: NOT DETECTED
Tetrahydrocannabinol: POSITIVE — AB

## 2015-12-01 LAB — CBC WITH DIFFERENTIAL/PLATELET
Basophils Absolute: 0 10*3/uL (ref 0.0–0.1)
Basophils Relative: 0 %
Eosinophils Absolute: 0.2 10*3/uL (ref 0.0–0.7)
Eosinophils Relative: 2 %
HEMATOCRIT: 46.7 % (ref 39.0–52.0)
HEMOGLOBIN: 15.8 g/dL (ref 13.0–17.0)
Lymphocytes Relative: 23 %
Lymphs Abs: 1.8 10*3/uL (ref 0.7–4.0)
MCH: 30.9 pg (ref 26.0–34.0)
MCHC: 33.8 g/dL (ref 30.0–36.0)
MCV: 91.4 fL (ref 78.0–100.0)
MONOS PCT: 9 %
Monocytes Absolute: 0.7 10*3/uL (ref 0.1–1.0)
Neutro Abs: 5 10*3/uL (ref 1.7–7.7)
Neutrophils Relative %: 66 %
Platelets: 262 10*3/uL (ref 150–400)
RBC: 5.11 MIL/uL (ref 4.22–5.81)
RDW: 12.9 % (ref 11.5–15.5)
WBC: 7.7 10*3/uL (ref 4.0–10.5)

## 2015-12-01 LAB — ACETAMINOPHEN LEVEL

## 2015-12-01 LAB — SALICYLATE LEVEL

## 2015-12-01 LAB — ETHANOL: Alcohol, Ethyl (B): <5 mg/dL (ref <5)

## 2015-12-01 MED ORDER — ALUM & MAG HYDROXIDE-SIMETH 200-200-20 MG/5ML PO SUSP: 30.0000 mL | ORAL | Status: DC | PRN

## 2015-12-01 MED ORDER — ACETAMINOPHEN 325 MG PO TABS: 650.0000 mg | ORAL_TABLET | ORAL | Status: DC | PRN

## 2015-12-01 MED ORDER — LORAZEPAM 2 MG/ML IJ SOLN
2.0000 mg | Freq: Once | INTRAMUSCULAR | Status: AC
  Administered 2015-12-01: 2 mg via INTRAMUSCULAR
  Filled 2015-12-01: qty 1

## 2015-12-01 MED ORDER — LORAZEPAM 1 MG PO TABS: 1.0000 mg | ORAL_TABLET | Freq: Three times a day (TID) | ORAL | Status: DC | PRN

## 2015-12-01 MED ORDER — ZOLPIDEM TARTRATE 5 MG PO TABS
5.0000 mg | ORAL_TABLET | Freq: Every evening | ORAL | Status: DC | PRN
Start: 2015-12-01 — End: 2015-12-01

## 2015-12-01 MED ORDER — NICOTINE 21 MG/24HR TD PT24: 21.0000 mg | MEDICATED_PATCH | Freq: Every day | TRANSDERMAL | Status: DC

## 2015-12-01 MED ORDER — ONDANSETRON HCL 4 MG PO TABS: 4.0000 mg | ORAL_TABLET | Freq: Three times a day (TID) | ORAL | Status: DC | PRN

## 2015-12-01 MED ORDER — ZIPRASIDONE MESYLATE 20 MG IM SOLR
20.0000 mg | Freq: Once | INTRAMUSCULAR | Status: AC
Start: 2015-12-01 — End: 2015-12-01
  Administered 2015-12-01: 20 mg via INTRAMUSCULAR
  Filled 2015-12-01: qty 20

## 2015-12-01 NOTE — ED Notes (Signed)
Pt awake and is calm and cooperative.  Lunch tray delivered and eaten.  Pt's VSS, denies pain and voices no needs.

## 2015-12-01 NOTE — ED Notes (Signed)
Patient in custody of [employer].  [facility]  [facility] did [facility] for [facility] for [facility].  Patient states that he is not HI or SI at this day.  He has had a recent eval last week for same.

## 2015-12-01 NOTE — BHH Counselor (Signed)
TTS counselor attempted to assess the Pt. TTS counselor was informed that the Pt is not awake because of Geodon. RN stated that he will remove the TTS consult and put in back in when the Pt is awake.  Karl Carroll, [facility] Triage Specialist

## 2015-12-01 NOTE — ED Notes (Signed)
Pt's belongings retrieved from safe and returned to pt who signed for same.  All other inventoried belongings also returned and signed for.

## 2015-12-01 NOTE — ED Notes (Signed)
Pt placed in marron scrub pants
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
## 2015-12-01 NOTE — BH Assessment (Signed)
Tele Assessment Note   Karl GinsJoseph Carroll is an 24 y.o. male. Pt denies SI/HI. Pt denies AVH. Pt states he contacted [facility] last night in order to obtain his medication. Pt stated he informed the crisis team that he pulls the scab off of sores. Pt states he was then IVCd by the crisis them. Pt states "I did not tell them I was SI/HI or that I was seeing things." Pt states he is prescribed Zoloft, Trileptal, Klonodin, and Topamax but he does not have the Klonodin or Topamax at this time. Pt has been hospitalized multiple times at [facility] for SI/HI. During Pt's [facility] he asked to leave the program early because he was not given Klonopin. Pt receives his medication from [facility]. [facility]. Pt was given Geodon. Pt has a history of violence. Pt has 2 pending charges for marijuana possessions. Pt states he would like to follow-up with [facility] in order to get his prescription filled. Pt reports occasional marijuana use. Pt denies alcohol use. Pt denies abuse.  Writer consulted with May, NP. Per May, NP Pt does not meet inpatient criteria. Pt can be D/C once EDP rescinds IVC.  Diagnosis:  F60.3 Borderline personality disorder; F31.81 Bipolar Past Medical History:  Past Medical History  Diagnosis Date  . SVT (supraventricular tachycardia) (HCC)   . Anxiety   . OCD (obsessive compulsive disorder)   . Antisocial personality disorder     Past Surgical History  Procedure Laterality Date  . Ankle arthroplasty Bilateral     Family History: No family history on file.  Social History:  reports that he has been smoking Cigarettes.  His smokeless tobacco use includes Snuff. He reports that he drinks alcohol. He reports that he uses illicit drugs (Marijuana).  Additional Social History:     CIWA: CIWA-Ar BP: 107/74 mmHg Pulse Rate: 71 COWS:    PATIENT STRENGTHS: (choose at least two) Average or above average intelligence Communication  skills  Allergies:  Allergies  Allergen Reactions  . Effexor [Venlafaxine] Anaphylaxis  . Haldol [Haloperidol Lactate] Anaphylaxis  . Darvon [Propoxyphene]     unknown  . Prozac [Fluoxetine Hcl]     unknown  . Trinitrophenol     unknown  . Ultram [Tramadol Hcl]     unknown  . Wellbutrin [Bupropion]     unknown  . Celexa [Citalopram Hydrobromide] Nausea And Vomiting and Rash  . Naprosyn [Naproxen] Rash    Hand burn  . Tape Itching and Rash    Home Medications:  (Not in a hospital admission)  OB/GYN Status:  No LMP for male patient.  General Assessment Data [facility] T[facility] Assessment: In system Is this a Tele or Face-to-Face Assessment?: Tele Assessment Is this an Initial Assessment or a Re-assessment for this encounter?: Initial Assessment Marital status: Long term relationship Maiden name: NA Is patient pregnant?: No Pregnancy Status: No Living Arrangements: Spouse/significant other Can pt return to current living arrangement?: Yes Admission Status: Involuntary Is patient capable of signing voluntary admission?: No Referral Source: Self/Family/Friend Insurance type: Medicaid     Crisis Care Plan Living Arrangements: Spouse/significant other Legal Guardian: Other: [facility] of [facility]rist: [facility] Name of Therapist: [facility] in January ([facility]T group)  Education Status Is patient currently in school?: No Current Grade: NA Highest grade of school patient has completed: 11th grade. kicked out for selling drugs. "Got my GED" "I want to puruse computer information technology."  Name of school: n/a  Contact person: NA  Risk to self with the past 6 months Suicidal Ideation: No Has patient been a risk to self within the past 6 months prior to admission? : Yes Suicidal Intent: No Has patient had any suicidal intent within the past 6 months prior to admission? : No Is patient at risk for suicide?: No Suicidal Plan?: No Has patient had any  suicidal plan within the past 6 months prior to admission? : Yes Access to Means: No Specify Access to Suicidal Means: NA What has been your use of drugs/alcohol within the last 12 months?: Marijuana Previous Attempts/Gestures: Yes How many times?: 1 Other Self Harm Risks: pulling scabs Triggers for Past Attempts: Unpredictable Intentional Self Injurious Behavior: Cutting, Bruising Comment - Self Injurious Behavior: Pt pulls off scabs Family Suicide History: No Recent stressful life event(s): Other (Comment) (in need of medication) Persecutory voices/beliefs?: No Depression: No Depression Symptoms:  (pt denies) Substance abuse history and/or treatment for substance abuse?: Yes Suicide prevention information given to non-admitted patients: Not applicable  Risk to Others within the past 6 months Homicidal Ideation: No Does patient have any lifetime risk of violence toward others beyond the six months prior to admission? : Yes (comment) Thoughts of Harm to Others: No Comment - Thoughts of Harm to Others: NA Current Homicidal Intent: No Current Homicidal Plan: No Access to Homicidal Means: No Identified Victim: NA History of harm to others?: Yes Assessment of Violence: None Noted Violent Behavior Description: NA Does patient have access to weapons?: No Criminal Charges Pending?: Yes Describe Pending Criminal Charges: possession of marijuana Does patient have a court date: Yes Court Date: 12/19/15 Is patient on probation?: No     Mental Status Report Appearance/Hygiene: Unremarkable Eye Contact: Good Motor Activity: Freedom of movement Speech: Logical/coherent Level of Consciousness: Alert Mood: Euthymic Affect: Appropriate to circumstance Anxiety Level: Minimal Thought Processes: Coherent, Relevant Judgement: Unimpaired Orientation: Person, Place, Time, Situation Obsessive Compulsive Thoughts/Behaviors: None  Cognitive Functioning Concentration: Normal Memory:  Recent Intact, Remote Intact IQ: Average Insight: Fair Impulse Control: Fair Appetite: Fair Weight Loss: 0 Weight Gain: 0 Sleep: Decreased Total Hours of Sleep: 4 Vegetative Symptoms: None  ADLScreening [facility] Assessment Services) Patient's cognitive ability adequate to safely complete daily activities?: Yes Patient able to express need for assistance with ADLs?: Yes Independently performs ADLs?: Yes (appropriate for developmental age)  Prior Inpatient Therapy Prior Inpatient Therapy: Yes Prior Therapy Dates: 11/21/15-11/24/15; multiple admits Prior Therapy Facilty/Provider(s): [facility], other facilities Reason for Treatment: Bipolar disorder, OCD  Prior Outpatient Therapy Prior Outpatient Therapy: Yes Prior Therapy Dates: Current Prior Therapy Facilty/Provider(s): [facility] Reason for Treatment: Bipolar disorder Does patient have an ACCT team?: No Does patient have Intensive In-House Services?  : No Does patient have [facility] services? : No Does patient have P4CC services?: Unknown  ADL Screening (condition at time of admission) Patient's cognitive ability adequate to safely complete daily activities?: Yes Patient able to express need for assistance with ADLs?: Yes Independently performs ADLs?: Yes (appropriate for developmental age)                  Additional Information 1:1 In Past 12 Months?: No CIRT Risk: Yes Elopement Risk: No Does patient have medical clearance?: Yes     Disposition:  Disposition Initial Assessment Completed for this Encounter: Yes Disposition of Patient: Outpatient treatment (referred to previous provider) Type of outpatient treatment: Adult Other disposition(s): Other (Comment)  Karl Carroll D 12/01/2015 12:29 PM

## 2015-12-01 NOTE — ED Provider Notes (Signed)
CSN: [id number]     Arrival date & time 12/01/15  0600 History   First MD Initiated Contact with Patient 12/01/15 [phone]     Chief Complaint  Patient presents with  . Medical Clearance     (Consider location/radiation/quality/duration/timing/severity/associated sxs/prior Treatment) The history is provided by the patient, the police and medical records.   24 year old male with history of SVT, anxiety, OCD, antisocial personality disorder, presenting to the ED under IVC petitioned by [facility] [place]. Paperwork reports patient has suicidal and homicidal ideation. Patient denies this. He admits that he has been admitted multiple times recently for suicidal ideation and self-mutilation, but denies this currently. He states he called [facility] crisis for help with his medications. He states he was started on several medications while at [facility] last week, however he is unable to afford them. He also seems very upset that he was not prescribed Klonopin again.  Patient states he does not want to talk to the psychiatrist and does not want to be here in the ED, he only wants help with his medications.  Denies physical complaints at this time.  Past Medical History  Diagnosis Date  . SVT (supraventricular tachycardia) (HCC)   . Anxiety   . OCD (obsessive compulsive disorder)   . Antisocial personality disorder    Past Surgical History  Procedure Laterality Date  . Ankle arthroplasty Bilateral    No family history on file. Social History  Substance Use Topics  . Smoking status: Heavy Tobacco Smoker    Types: Cigarettes  . Smokeless tobacco: Current User    Types: Snuff  . Alcohol Use: Yes     Comment: occ    Review of Systems  Unable to perform ROS: Psychiatric disorder      Allergies  Effexor; Haldol; Darvon; Prozac; Trinitrophenol; Ultram; Wellbutrin; Celexa; Naprosyn; and Tape  Home Medications   Prior to Admission medications   Medication Sig Start Date End Date Taking?  Authorizing Provider  metoprolol succinate (TOPROL-XL) 25 MG 24 hr tablet Take 1 tablet (25 mg total) by mouth daily. For high blood pressure 11/28/15   Sanjuana Kava, NP  oxcarbazepine (TRILEPTAL) 600 MG tablet Take 1 tablet (600 mg total) by mouth 2 (two) times daily. For mood stabilization 11/28/15   Sanjuana Kava, NP  QUEtiapine (SEROQUEL) 100 MG tablet Take 1 tablet (100 mg total) by mouth at bedtime and may repeat dose one time if needed. For mood control 11/28/15   Sanjuana Kava, NP  sulfamethoxazole-trimethoprim (BACTRIM DS,SEPTRA DS) 800-160 MG tablet Take 1 tablet by mouth 2 (two) times daily. For wound infection 11/28/15   Sanjuana Kava, NP  topiramate (TOPAMAX) 50 MG tablet Take 1 tablet (50 mg total) by mouth 2 (two) times daily. For mood stabilization 11/28/15   Sanjuana Kava, NP   BP 126/84 mmHg  Pulse 94  Temp(Src) 98.6 F (37 C) (Oral)  Resp 20  Ht  (1.727 m)  Wt 106.142 kg  BMI 35.59 kg/m2  SpO2 95%   Physical Exam  Constitutional: He is oriented to person, place, and time. He appears well-developed and well-nourished. No distress.  Reading notes off cell phone from prior hospitalizations; will not make eye contact  HENT:  Head: Normocephalic and atraumatic.  Mouth/Throat: Oropharynx is clear and moist.  Eyes: Conjunctivae and EOM are normal. Pupils are equal, round, and reactive to light.  Neck: Normal range of motion. Neck supple.  Cardiovascular: Normal rate, regular rhythm and normal heart sounds.  Pulmonary/Chest: Effort normal and breath sounds normal. No respiratory distress. He has no wheezes.  Abdominal: Soft. Bowel sounds are normal. There is no tenderness. There is no guarding.  Musculoskeletal: Normal range of motion. He exhibits no edema.  Neurological: He is alert and oriented to person, place, and time.  Skin: Skin is warm and dry. He is not diaphoretic.  Psychiatric: His affect is angry. He is agitated and aggressive.  Initially calm but became  aggressive, hostile, and agitated; threatening to harm [employer] and hospital staff if not released  Nursing note and vitals reviewed.   ED Course  Procedures (including critical care time) Labs Review Labs Reviewed  COMPREHENSIVE METABOLIC PANEL - Abnormal; Notable for the following:    Glucose, Bld 115 (*)    All other components within normal limits  URINE RAPID DRUG SCREEN, HOSP PERFORMED - Abnormal; Notable for the following:    Benzodiazepines POSITIVE (*)    Tetrahydrocannabinol POSITIVE (*)    All other components within normal limits  ACETAMINOPHEN LEVEL - Abnormal; Notable for the following:    Acetaminophen (Tylenol), Serum <10 (*)    All other components within normal limits  CBC WITH DIFFERENTIAL/PLATELET  ETHANOL  SALICYLATE LEVEL    Imaging Review No results found. I have personally reviewed and evaluated these images and lab results as part of my medical decision-making.   EKG Interpretation None      MDM   Final diagnoses:  Bipolar I disorder (HCC)    6:24 AM  Patient assessed.  During the exam and while he is providing history, he is reading notes off of his cell phone from his prior hospitalizations.  Patient is initially very calm, however his presentation is very rehearsed.  He informs me that he does not feel he needs to be IVC'd, however when I discussed with him that he has already been IVC'd by [facility], he gets very hostile. He states that is against his legal rights. He proceeds to tell me how he is a 10 time felon with multiple misdemeanors and is potentially facing 10 years for his crimes. He states he does not talk to psychiatrist and 15 minutes he will run out of here, attack police office and staff with his martial arts training, and if anyone tries to stop him he will kill them.  He states he hopes they have tasers because he likes pain, however he does not do well with pepper spray.  I left the room, notified by nurses that patient is becoming  increasingly aggressive.  He is taking off his clothes and preparing to fight.  Security paged to room, [employer] back at bedside.  Meds ordered should patient become increasingly aggressive.  6:40 AM Patient has spit at Engineer, materials and lunged at police in attempt to fight.  Patient given 20 mg Geodon and 2 mg Ativan at this time.  He continues fighting, placed in restraints.  6:49 AM Patient has gotten loose of both upper extremity restraints.  Attempting to flee and swinging at [facility] again.  He is spitting at staff, mask placed on his face.  Patient was placed in handcuffs attached to bedrails at this time.  7:39 AM Patient sleeping.  VS remain stable.  TTS has been consulted, will check back later for assessment when patient is awake and able to talk.  8:23 AM Patient continues sleeping.  VS remain stable.  Sitter at bedside.  9:32 AM Patient resting.  Out of restraints at this time.  VS  remain stable.    10:31 AM Patient still sleeping.  VSS.  11:23 AM Patient now awake.  He states he is willing to cooperate with psych assessment.  He is requesting lunch, tray will be ordered.  Patient calm at this time.  12:07 PM TTS has evaluated patient and discussed with physician extender.  Feels patient is safe for discharge to follow-up with [facility].  I have expressed my concerns with patient's outbursts today, apparently this is his normal reaction when he is denied klonopin.  Dr. Hyacinth MeekerMiller will rescind IVC.  Patient to follow-up with [facility].  Garlon HatchetLisa M Jonathon Tan, PA-C 12/01/15 1430  Eber HongBrian Miller, MD 12/03/15 [phone]

## 2015-12-01 NOTE — Discharge Instructions (Signed)
Please follow-up with [facility] and your psychiatrist. Return here for new concerns.

## 2015-12-02 ENCOUNTER — Encounter (HOSPITAL_COMMUNITY): Payer: Self-pay

## 2015-12-02 ENCOUNTER — Emergency Department (HOSPITAL_COMMUNITY)
Admission: EM | Admit: 2015-12-02 | Discharge: 2015-12-02 | Disposition: A | Discharge status: Home / Self Care | Payer: Medicaid Other | Attending: Emergency Medicine | Admitting: Emergency Medicine

## 2015-12-02 DIAGNOSIS — R7401 Elevation of levels of liver transaminase levels: Secondary | ICD-10-CM

## 2015-12-02 DIAGNOSIS — E871 Hypo-osmolality and hyponatremia: Secondary | ICD-10-CM | POA: Insufficient documentation

## 2015-12-02 DIAGNOSIS — R748 Abnormal levels of other serum enzymes: Secondary | ICD-10-CM | POA: Insufficient documentation

## 2015-12-02 DIAGNOSIS — F17210 Nicotine dependence, cigarettes, uncomplicated: Secondary | ICD-10-CM | POA: Insufficient documentation

## 2015-12-02 DIAGNOSIS — T483X1A Poisoning by antitussives, accidental (unintentional), initial encounter: Secondary | ICD-10-CM | POA: Insufficient documentation

## 2015-12-02 DIAGNOSIS — F419 Anxiety disorder, unspecified: Secondary | ICD-10-CM | POA: Insufficient documentation

## 2015-12-02 DIAGNOSIS — Y9389 Activity, other specified: Secondary | ICD-10-CM | POA: Diagnosis not present

## 2015-12-02 DIAGNOSIS — I471 Supraventricular tachycardia: Secondary | ICD-10-CM | POA: Insufficient documentation

## 2015-12-02 DIAGNOSIS — F1210 Cannabis abuse, uncomplicated: Secondary | ICD-10-CM | POA: Diagnosis not present

## 2015-12-02 DIAGNOSIS — Y998 Other external cause status: Secondary | ICD-10-CM | POA: Diagnosis not present

## 2015-12-02 DIAGNOSIS — R740 Nonspecific elevation of levels of transaminase and lactic acid dehydrogenase [LDH]: Secondary | ICD-10-CM

## 2015-12-02 DIAGNOSIS — Z79899 Other long term (current) drug therapy: Secondary | ICD-10-CM | POA: Diagnosis not present

## 2015-12-02 DIAGNOSIS — T50901A Poisoning by unspecified drugs, medicaments and biological substances, accidental (unintentional), initial encounter: Secondary | ICD-10-CM

## 2015-12-02 DIAGNOSIS — F429 Obsessive-compulsive disorder, unspecified: Secondary | ICD-10-CM | POA: Diagnosis not present

## 2015-12-02 DIAGNOSIS — R17 Unspecified jaundice: Secondary | ICD-10-CM

## 2015-12-02 DIAGNOSIS — F1310 Sedative, hypnotic or anxiolytic abuse, uncomplicated: Secondary | ICD-10-CM | POA: Insufficient documentation

## 2015-12-02 DIAGNOSIS — Y9289 Other specified places as the place of occurrence of the external cause: Secondary | ICD-10-CM | POA: Diagnosis not present

## 2015-12-02 LAB — COMPREHENSIVE METABOLIC PANEL
ALBUMIN: 4.4 g/dL (ref 3.5–5.0)
ALT: 21 U/L (ref 17–63)
ANION GAP: 11 (ref 5–15)
AST: 95 U/L — AB (ref 15–41)
Alkaline Phosphatase: 66 U/L (ref 38–126)
BILIRUBIN TOTAL: 2 mg/dL — AB (ref 0.3–1.2)
BUN: 10 mg/dL (ref 6–20)
CALCIUM: 9.2 mg/dL (ref 8.9–10.3)
CO2: 24 mmol/L (ref 22–32)
CREATININE: 1.08 mg/dL (ref 0.61–1.24)
Chloride: 92 mmol/L — ABNORMAL LOW (ref 101–111)
GFR calc Af Amer: >60 mL/min (ref >60)
GFR calc non Af Amer: >60 mL/min (ref >60)
Glucose, Bld: 73 mg/dL (ref 65–99)
POTASSIUM: 4.9 mmol/L (ref 3.5–5.1)
SODIUM: 127 mmol/L — AB (ref 135–145)
TOTAL PROTEIN: 7.6 g/dL (ref 6.5–8.1)

## 2015-12-02 LAB — CBC
HCT: 42.4 % (ref 39.0–52.0)
Hemoglobin: 14.4 g/dL (ref 13.0–17.0)
MCH: 30.8 pg (ref 26.0–34.0)
MCHC: 34 g/dL (ref 30.0–36.0)
MCV: 90.6 fL (ref 78.0–100.0)
PLATELETS: 249 10*3/uL (ref 150–400)
RBC: 4.68 MIL/uL (ref 4.22–5.81)
RDW: 12.8 % (ref 11.5–15.5)
WBC: 13.2 10*3/uL — ABNORMAL HIGH (ref 4.0–10.5)

## 2015-12-02 LAB — ACETAMINOPHEN LEVEL: Acetaminophen (Tylenol), Serum: <10 ug/mL — ABNORMAL LOW (ref 10–30)

## 2015-12-02 LAB — RAPID URINE DRUG SCREEN, HOSP PERFORMED
Amphetamines: NOT DETECTED
Barbiturates: NOT DETECTED
Benzodiazepines: POSITIVE — AB
Cocaine: NOT DETECTED
OPIATES: NOT DETECTED
Tetrahydrocannabinol: POSITIVE — AB

## 2015-12-02 LAB — DIFFERENTIAL
BASOS ABS: 0 10*3/uL (ref 0.0–0.1)
Basophils Relative: 0 %
EOS ABS: 0.1 10*3/uL (ref 0.0–0.7)
Eosinophils Relative: 1 %
LYMPHS ABS: 2.4 10*3/uL (ref 0.7–4.0)
LYMPHS PCT: 18 %
Monocytes Absolute: 1.3 10*3/uL — ABNORMAL HIGH (ref 0.1–1.0)
Monocytes Relative: 10 %
NEUTROS PCT: 71 %
Neutro Abs: 9.4 10*3/uL — ABNORMAL HIGH (ref 1.7–7.7)

## 2015-12-02 LAB — CBG MONITORING, ED: Glucose-Capillary: 79 mg/dL (ref 65–99)

## 2015-12-02 LAB — SALICYLATE LEVEL

## 2015-12-02 LAB — ETHANOL

## 2015-12-02 MED ORDER — GI COCKTAIL ~~LOC~~
30.0000 mL | Freq: Once | ORAL | Status: AC
Start: 2015-12-02 — End: 2015-12-02
  Administered 2015-12-02: 30 mL via ORAL
  Filled 2015-12-02: qty 30

## 2015-12-02 MED ORDER — PANTOPRAZOLE SODIUM 40 MG PO TBEC
40.0000 mg | DELAYED_RELEASE_TABLET | Freq: Once | ORAL | Status: AC
  Administered 2015-12-02: 40 mg via ORAL
  Filled 2015-12-02: qty 1

## 2015-12-02 MED ORDER — SODIUM CHLORIDE 0.9 % IV BOLUS (SEPSIS)
1000.0000 mL | Freq: Once | INTRAVENOUS | Status: AC
  Administered 2015-12-02: 1000 mL via INTRAVENOUS

## 2015-12-02 NOTE — ED Notes (Signed)
Bed: RESB Expected date:  Expected time:  Means of arrival:  Comments: EMS Overdose on Triple C

## 2015-12-02 NOTE — ED Notes (Signed)
Pt states that he's not suicidal that he took the medication to self medicate and has been doing so for several years

## 2015-12-02 NOTE — ED Notes (Signed)
Poison control recommends an EKG and tylenol level, observation for 4-6 hours, pt could be tachycardic, drowsy, and have QRS widening.

## 2015-12-02 NOTE — Discharge Instructions (Signed)
Do not take cough medicine like Coriciden.  2 of your liver tests were abnormal today. AST and bilirubin. These need to be repeated in one week. P careful, do not take any acetaminophen because they can make the liver tests worse. Also, do not drink any alcohol for the same reason.  Accidental Overdose A drug overdose occurs when a chemical substance (drug or medication) is used in amounts large enough to overcome a person. This may result in severe illness or death. This is a type of poisoning. Accidental overdoses of medications or other substances come from a variety of reasons. When this happens accidentally, it is often because the person taking the substance does not know enough about what they have taken. Drugs which commonly cause overdose deaths are alcohol, psychotropic medications (medications which affect the mind), pain medications, illegal drugs (street drugs) such as cocaine and heroin, and multiple drugs taken at the same time. It may result from careless behavior (such as over-indulging at a party). Other causes of overdose may include multiple drug use, a lapse in memory, or drug use after a period of no drug use.  Sometimes overdosing occurs because a person cannot remember if they have taken their medication.  A common unintentional overdose in young children involves multi-vitamins containing iron. Iron is a part of the hemoglobin molecule in blood. It is used to transport oxygen to living cells. When taken in small amounts, iron allows the body to restock hemoglobin. In large amounts, it causes problems in the body. If this overdose is not treated, it can lead to death. Never take medicines that show signs of tampering or do not seem quite right. Never take medicines in the dark or in poor lighting. Read the label and check each dose of medicine before you take it. When adults are poisoned, it happens most often through carelessness or lack of information. Taking medicines in the dark  or taking medicine prescribed for someone else to treat the same type of problem is a dangerous practice. SYMPTOMS  Symptoms of overdose depend on the medication and amount taken. They can vary from over-activity with stimulant over-dosage, to sleepiness from depressants such as alcohol, narcotics and tranquilizers. Confusion, dizziness, nausea and vomiting may be present. If problems are severe enough coma and death may result. DIAGNOSIS  Diagnosis and management are generally straightforward if the drug is known. Otherwise it is more difficult. At times, certain symptoms and signs exhibited by the patient, or blood tests, can reveal the drug in question.  TREATMENT  In an emergency department, most patients can be treated with supportive measures. Antidotes may be available if there has been an overdose of opioids or benzodiazepines. A rapid improvement will often occur if this is the cause of overdose. At home or away from medical care:  There may be no immediate problems or warning signs in children.  Not everything works well in all cases of poisoning.  Take immediate action. Poisons may act quickly.  If you think someone has swallowed medicine or a household product, and the person is unconscious, having seizures (convulsions), or is not breathing, immediately call for an ambulance. IF a person is conscious and appears to be doing OK but has swallowed a poison:  Do not wait to see what effect the poison will have. Immediately call a poison control center (listed in the white pages of your telephone book under "Poison Control" or inside the front cover with other emergency numbers). Some poison control centers have  TTY capability for the deaf. Check with your local center if you or someone in your family requires this service.  Keep the container so you can read the label on the product for ingredients.  Describe what, when, and how much was taken and the age and condition of the person  poisoned. Inform them if the person is vomiting, choking, drowsy, shows a change in color or temperature of skin, is conscious or unconscious, or is convulsing.  Do not cause vomiting unless instructed by medical personnel. Do not induce vomiting or force liquids into a person who is convulsing, unconscious, or very drowsy. Stay calm and in control.   Activated charcoal also is sometimes used in certain types of poisoning and you may wish to add a supply to your emergency medicines. It is available without a prescription. Call a poison control center before using this medication. PREVENTION  Thousands of children die every year from unintentional poisoning. This may be from household chemicals, poisoning from carbon monoxide in a car, taking their parent's medications, or simply taking a few iron pills or vitamins with iron. Poisoning comes from unexpected sources.  Store medicines out of the sight and reach of children, preferably in a locked cabinet. Do not keep medications in a food cabinet. Always store your medicines in a secure place. Get rid of expired medications.  If you have children living with you or have them as occasional guests, you should have child-resistant caps on your medicine containers. Keep everything out of reach. Child proof your home.  If you are called to the telephone or to answer the door while you are taking a medicine, take the container with you or put the medicine out of the reach of small children.  Do not take your medication in front of children. Do not tell your child how good a medication is and how good it is for them. They may get the idea it is more of a treat.  If you are an adult and have accidentally taken an overdose, you need to consider how this happened and what can be done to prevent it from happening again. If this was from a street drug or alcohol, determine if there is a problem that needs addressing. If you are not sure a problems exists, it is  easy to talk to a professional and ask them if they think you have a problem. It is better to handle this problem in this way before it happens again and has a much worse consequence.   This information is not intended to replace advice given to you by your health care provider. Make sure you discuss any questions you have with your health care provider.   Document Released: 02/09/2005 Document Revised: 12/17/2014 Document Reviewed: 05/16/2015 Elsevier Interactive Patient Education [employer]2016 [employer] Inc[employer]

## 2015-12-02 NOTE — ED Provider Notes (Signed)
CSN: [id number]     Arrival date & time 12/02/15  [id number] History  By signing my name below, I, Soijett Blue, attest that this documentation has been prepared under the direction and in the presence of Dione Boozeavid Aevah Stansbery, MD. Electronically Signed: Soijett Blue, ED Scribe. 12/02/2015. 3:35 AM.   Chief Complaint  Patient presents with  . Ingestion      The history is provided by the patient. No language interpreter was used.    HPI Comments: Karl Carroll is a 24 y.o. male with a medical hx of Anxiety, OCD, and antisocial personality disorder, who presents to the Emergency Department via EMS complaining of ingestion of 60 tablets of Triple C onset 5 days. He reports that he has been self medicating himself for treatment of his OCD with Triple C x 5 days and within the last 4 days he has taken 8,000 mg. He reports that his last dose was 6 PM yesterday. He states that "[facility] gave me klonipin but that don't do nothing for me." He states "I like the feeling of hurting myself due to pain that I feel", but the pt denies feeling suicidal at this time. He denies SI/HI, hallucinations, and any other symptoms.   Per pt chart review: Pt was seen in the ED on 12/01/2015 for medical clearance and he didn't meet inpatient criteria for Behavioral Health needs and was d/c and [facility]    Past Medical History  Diagnosis Date  . SVT (supraventricular tachycardia) (HCC)   . Anxiety   . OCD (obsessive compulsive disorder)   . Antisocial personality disorder    Past Surgical History  Procedure Laterality Date  . Ankle arthroplasty Bilateral    History reviewed. No pertinent family history. Social History  Substance Use Topics  . Smoking status: Heavy Tobacco Smoker    Types: Cigarettes  . Smokeless tobacco: Current User    Types: Snuff  . Alcohol Use: Yes     Comment: occ    Review of Systems  Psychiatric/Behavioral: Negative for suicidal ideas and hallucinations.       No HI   All other systems reviewed and are negative.    Allergies  Effexor; Haldol; Darvon; Prozac; Trinitrophenol; Ultram; Wellbutrin; Celexa; Naprosyn; and Tape  Home Medications   Prior to Admission medications   Medication Sig Start Date End Date Taking? Authorizing Provider  metoprolol succinate (TOPROL-XL) 25 MG 24 hr tablet Take 1 tablet (25 mg total) by mouth daily. For high blood pressure 11/28/15   Sanjuana KavaAgnes I Nwoko, NP  oxcarbazepine (TRILEPTAL) 600 MG tablet Take 1 tablet (600 mg total) by mouth 2 (two) times daily. For mood stabilization 11/28/15   Sanjuana KavaAgnes I Nwoko, NP  QUEtiapine (SEROQUEL) 100 MG tablet Take 1 tablet (100 mg total) by mouth at bedtime and may repeat dose one time if needed. For mood control 11/28/15   Sanjuana KavaAgnes I Nwoko, NP  sulfamethoxazole-trimethoprim (BACTRIM DS,SEPTRA DS) 800-160 MG tablet Take 1 tablet by mouth 2 (two) times daily. For wound infection 11/28/15   Sanjuana KavaAgnes I Nwoko, NP  topiramate (TOPAMAX) 50 MG tablet Take 1 tablet (50 mg total) by mouth 2 (two) times daily. For mood stabilization 11/28/15   Sanjuana KavaAgnes I Nwoko, NP   There were no vitals taken for this visit. Physical Exam  Constitutional: He is oriented to person, place, and time. He appears well-developed and well-nourished. No distress.  HENT:  Head: Normocephalic and atraumatic.  Eyes: EOM are normal. Pupils are equal, round, and  reactive to light.  Neck: Normal range of motion. Neck supple. No JVD present.  Cardiovascular: Normal rate, regular rhythm and normal heart sounds.  Exam reveals no gallop and no friction rub.   No murmur heard. Pulmonary/Chest: Effort normal and breath sounds normal. He has no wheezes. He has no rales. He exhibits no tenderness.  Abdominal: Soft. Bowel sounds are normal. He exhibits no mass. There is no tenderness.  Musculoskeletal: Normal range of motion.  Scar on the flexor surface left forearm presumably from self mutilation. Granulation tissue present.   Lymphadenopathy:     He has no cervical adenopathy.  Neurological: He is alert and oriented to person, place, and time. No cranial nerve deficit. He exhibits normal muscle tone. Coordination normal.  Speech slightly slow and mildly dysarthric.   Skin: Skin is warm and dry. No rash noted.  Nursing note and vitals reviewed.   ED Course  Procedures (including critical care time) DIAGNOSTIC STUDIES: Oxygen Saturation is 98% on RA, nl by my interpretation.    COORDINATION OF CARE: 3:29 AM Discussed treatment plan with pt at bedside which includes labs and EKG and pt agreed to plan.    Labs Review Results for orders placed or performed during the hospital encounter of 12/02/15  Comprehensive metabolic panel  Result Value Ref Range   Sodium 127 (L) 135 - 145 mmol/L   Potassium 4.9 3.5 - 5.1 mmol/L   Chloride 92 (L) 101 - 111 mmol/L   CO2 24 22 - 32 mmol/L   Glucose, Bld 73 65 - 99 mg/dL   BUN 10 6 - 20 mg/dL   Creatinine, Ser 7.56 0.61 - 1.24 mg/dL   Calcium 9.2 8.9 - 43.3 mg/dL   Total Protein 7.6 6.5 - 8.1 g/dL   Albumin 4.4 3.5 - 5.0 g/dL   AST 95 (H) 15 - 41 U/L   ALT 21 17 - 63 U/L   Alkaline Phosphatase 66 38 - 126 U/L   Total Bilirubin 2.0 (H) 0.3 - 1.2 mg/dL   GFR calc non Af Amer >60 >60 mL/min   GFR calc Af Amer >60 >60 mL/min   Anion gap 11 5 - 15  Ethanol (ETOH)  Result Value Ref Range   Alcohol, Ethyl (B) <5 <5 mg/dL  Salicylate level  Result Value Ref Range   Salicylate Lvl <4.0 2.8 - 30.0 mg/dL  Acetaminophen level  Result Value Ref Range   Acetaminophen (Tylenol), Serum <10 (L) 10 - 30 ug/mL  CBC  Result Value Ref Range   WBC 13.2 (H) 4.0 - 10.5 K/uL   RBC 4.68 4.22 - 5.81 MIL/uL   Hemoglobin 14.4 13.0 - 17.0 g/dL   HCT 29.5 18.8 - 41.6 %   MCV 90.6 78.0 - 100.0 fL   MCH 30.8 26.0 - 34.0 pg   MCHC 34.0 30.0 - 36.0 g/dL   RDW 60.6 30.1 - 60.1 %   Platelets 249 150 - 400 K/uL  Urine rapid drug screen (hosp performed) (Not at [facility])  Result Value Ref Range   Opiates NONE  DETECTED NONE DETECTED   Cocaine NONE DETECTED NONE DETECTED   Benzodiazepines POSITIVE (A) NONE DETECTED   Amphetamines NONE DETECTED NONE DETECTED   Tetrahydrocannabinol POSITIVE (A) NONE DETECTED   Barbiturates NONE DETECTED NONE DETECTED  Differential  Result Value Ref Range   Neutrophils Relative % 71 %   Neutro Abs 9.4 (H) 1.7 - 7.7 K/uL   Lymphocytes Relative 18 %   Lymphs Abs 2.4 0.7 -  4.0 K/uL   Monocytes Relative 10 %   Monocytes Absolute 1.3 (H) 0.1 - 1.0 K/uL   Eosinophils Relative 1 %   Eosinophils Absolute 0.1 0.0 - 0.7 K/uL   Basophils Relative 0 %   Basophils Absolute 0.0 0.0 - 0.1 K/uL  CBG monitoring, ED  Result Value Ref Range   Glucose-Capillary 79 65 - 99 mg/dL   I have personally reviewed and evaluated these lab results as part of my medical decision-making.   EKG Interpretation   Date/Time:  Friday December 02 2015 03:37:16 EST Ventricular Rate:  87 PR Interval:  126 QRS Duration: 111 QT Interval:  410 QTC Calculation: 493 R Axis:   63 Text Interpretation:  Sinus rhythm Prolonged QT interval Baseline wander  in lead(s) V2 When compared with ECG of 10/19/2010, QT has lengthened  Confirmed by [facility]  MD, Hajer Dwyer ([id number]) on 12/02/2015 4:05:41 AM      CRITICAL CARE Performed by: BJYNW,GNFAO Total critical care time: 40 minutes Critical care time was exclusive of separately billable procedures and treating other patients. Critical care was necessary to treat or prevent imminent or life-threatening deterioration. Critical care was time spent personally by me on the following activities: development of treatment plan with patient and/or surrogate as well as nursing, discussions with consultants, evaluation of patient's response to treatment, examination of patient, obtaining history from patient or surrogate, ordering and performing treatments and interventions, ordering and review of laboratory studies, ordering and review of radiographic studies, pulse  oximetry and re-evaluation of patient's condition.  MDM   Final diagnoses:  Accidental drug overdose, initial encounter  Elevated AST (SGOT)  Total bilirubin, elevated  Hyponatremia    Overdose of dextromethorphan. Patient is taking this for self-medication purposes and not as a suicidal attempt. Old records are reviewed and he has recent behavioral health admission and a recent ED visit where he was also found to not be suicidal. On those occasions, he had been very agitated insisting that he must get clonazepam since feeling that helps him. Curiously, today he states the clonazepam doesn't help him at all. He was initially very cooperative. Poison control was consult and recommended observation for 4-6 hours. He was observed in the ED with no arrhythmia symptoms and proved mental status. Patient and stated that he wished to leave. He started to get agitated as he had been at any other locations. I do not see any indication for involuntary commitment since he did not have suicidal intent. Laboratory workup was significant for hyponatremia, mild elevation of AST and bilirubin which were new. I've explained these to the patient and told him that he should not consume any alcohol or any acetaminophen and needed to have the levels rechecked in one week. He expressed understanding.  I personally performed the services described in this documentation, which was scribed in my presence. The recorded information has been reviewed and is accurate.      Dione Booze, MD 12/02/15 [phone]

## 2015-12-02 NOTE — ED Notes (Signed)
Pt at charge desk requesting to leave. Pt was advised that poison control advised that pt be monitored for 4-6 hours. Pt states that he is "bored and you are fucking pissing me off". Pt was advised to please sit in room and wait. Security was notified to stand by. Dr Preston FleetingGlick notified that pt sts " I am leaving at 7:30 no matter what". Pt is A&O and walks with steady gait.

## 2015-12-02 NOTE — ED Notes (Signed)
Pt was [facility] and discharged for his bipolar and didn't meet inpatient criteria. This am patient's family called EMS because he took 60 tablets of triple C in the last 24 hours

## 2015-12-21 ENCOUNTER — Encounter (HOSPITAL_COMMUNITY): Payer: Self-pay | Admitting: Family Medicine

## 2015-12-21 ENCOUNTER — Emergency Department (HOSPITAL_COMMUNITY)
Admission: EM | Admit: 2015-12-21 | Discharge: 2015-12-22 | Disposition: A | Discharge status: Home / Self Care | Payer: Medicaid Other | Source: Home / Self Care | Attending: Emergency Medicine | Admitting: Emergency Medicine

## 2015-12-21 DIAGNOSIS — Z96662 Presence of left artificial ankle joint: Secondary | ICD-10-CM | POA: Insufficient documentation

## 2015-12-21 DIAGNOSIS — Z79899 Other long term (current) drug therapy: Secondary | ICD-10-CM | POA: Diagnosis not present

## 2015-12-21 DIAGNOSIS — Y9289 Other specified places as the place of occurrence of the external cause: Secondary | ICD-10-CM | POA: Insufficient documentation

## 2015-12-21 DIAGNOSIS — S99911A Unspecified injury of right ankle, initial encounter: Secondary | ICD-10-CM | POA: Insufficient documentation

## 2015-12-21 DIAGNOSIS — S92251A Displaced fracture of navicular [scaphoid] of right foot, initial encounter for closed fracture: Secondary | ICD-10-CM | POA: Insufficient documentation

## 2015-12-21 DIAGNOSIS — F419 Anxiety disorder, unspecified: Secondary | ICD-10-CM | POA: Diagnosis not present

## 2015-12-21 DIAGNOSIS — M79641 Pain in right hand: Secondary | ICD-10-CM | POA: Diagnosis not present

## 2015-12-21 DIAGNOSIS — Z8679 Personal history of other diseases of the circulatory system: Secondary | ICD-10-CM | POA: Diagnosis not present

## 2015-12-21 DIAGNOSIS — F429 Obsessive-compulsive disorder, unspecified: Secondary | ICD-10-CM | POA: Insufficient documentation

## 2015-12-21 DIAGNOSIS — Z96661 Presence of right artificial ankle joint: Secondary | ICD-10-CM | POA: Insufficient documentation

## 2015-12-21 DIAGNOSIS — I471 Supraventricular tachycardia: Secondary | ICD-10-CM | POA: Insufficient documentation

## 2015-12-21 DIAGNOSIS — Y9389 Activity, other specified: Secondary | ICD-10-CM

## 2015-12-21 DIAGNOSIS — F17210 Nicotine dependence, cigarettes, uncomplicated: Secondary | ICD-10-CM | POA: Diagnosis not present

## 2015-12-21 DIAGNOSIS — Y998 Other external cause status: Secondary | ICD-10-CM

## 2015-12-21 DIAGNOSIS — Z792 Long term (current) use of antibiotics: Secondary | ICD-10-CM

## 2015-12-21 DIAGNOSIS — S92252A Displaced fracture of navicular [scaphoid] of left foot, initial encounter for closed fracture: Secondary | ICD-10-CM

## 2015-12-21 DIAGNOSIS — S199XXA Unspecified injury of neck, initial encounter: Secondary | ICD-10-CM | POA: Insufficient documentation

## 2015-12-21 DIAGNOSIS — M25571 Pain in right ankle and joints of right foot: Secondary | ICD-10-CM | POA: Diagnosis not present

## 2015-12-21 DIAGNOSIS — F901 Attention-deficit hyperactivity disorder, predominantly hyperactive type: Secondary | ICD-10-CM | POA: Diagnosis not present

## 2015-12-21 DIAGNOSIS — M25572 Pain in left ankle and joints of left foot: Secondary | ICD-10-CM | POA: Diagnosis not present

## 2015-12-21 HISTORY — DX: Attention-deficit hyperactivity disorder, unspecified type: F90.9
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
## 2015-12-22 ENCOUNTER — Emergency Department (HOSPITAL_COMMUNITY)
Admission: EM | Admit: 2015-12-22 | Discharge: 2015-12-22 | Disposition: A | Discharge status: Home / Self Care | Payer: Medicaid Other | Attending: Emergency Medicine | Admitting: Emergency Medicine

## 2015-12-22 ENCOUNTER — Encounter (HOSPITAL_COMMUNITY): Payer: Self-pay | Admitting: Nurse Practitioner

## 2015-12-22 ENCOUNTER — Emergency Department (HOSPITAL_COMMUNITY): Payer: Medicaid Other

## 2015-12-22 ENCOUNTER — Encounter (HOSPITAL_COMMUNITY): Payer: Self-pay | Admitting: Emergency Medicine

## 2015-12-22 DIAGNOSIS — Z8679 Personal history of other diseases of the circulatory system: Secondary | ICD-10-CM | POA: Insufficient documentation

## 2015-12-22 DIAGNOSIS — F17210 Nicotine dependence, cigarettes, uncomplicated: Secondary | ICD-10-CM | POA: Insufficient documentation

## 2015-12-22 DIAGNOSIS — Z79899 Other long term (current) drug therapy: Secondary | ICD-10-CM | POA: Insufficient documentation

## 2015-12-22 DIAGNOSIS — M79641 Pain in right hand: Secondary | ICD-10-CM | POA: Insufficient documentation

## 2015-12-22 DIAGNOSIS — F901 Attention-deficit hyperactivity disorder, predominantly hyperactive type: Secondary | ICD-10-CM | POA: Insufficient documentation

## 2015-12-22 DIAGNOSIS — M25571 Pain in right ankle and joints of right foot: Secondary | ICD-10-CM

## 2015-12-22 DIAGNOSIS — M25572 Pain in left ankle and joints of left foot: Secondary | ICD-10-CM | POA: Insufficient documentation

## 2015-12-22 DIAGNOSIS — F419 Anxiety disorder, unspecified: Secondary | ICD-10-CM | POA: Insufficient documentation

## 2015-12-22 MED ORDER — METHOCARBAMOL 500 MG PO TABS
500.0000 mg | ORAL_TABLET | Freq: Once | ORAL | Status: AC
  Administered 2015-12-22: 500 mg via ORAL
  Filled 2015-12-22: qty 1

## 2015-12-22 MED ORDER — METOPROLOL TARTRATE 25 MG PO TABS: 25.0000 mg | ORAL_TABLET | Freq: Every day | ORAL | Status: AC

## 2015-12-22 NOTE — ED Notes (Signed)
Pt stable, ambulatory, states understanding of discharge instructions 
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
## 2015-12-22 NOTE — ED Notes (Signed)
Post-Op Shoe applied to left foot.

## 2015-12-22 NOTE — ED Notes (Addendum)
Patient transported to X-Ray 

## 2016-11-20 ENCOUNTER — Ambulatory Visit (HOSPITAL_COMMUNITY)
Admission: EM | Admit: 2016-11-20 | Discharge: 2016-11-20 | Disposition: A | Discharge status: Home / Self Care | Payer: Medicaid Other | Source: Intra-hospital | Attending: Psychiatry | Admitting: Psychiatry

## 2016-11-20 ENCOUNTER — Emergency Department (HOSPITAL_COMMUNITY)
Admission: EM | Admit: 2016-11-20 | Discharge: 2016-11-21 | Disposition: A | Discharge status: Home / Self Care | Payer: Medicaid Other | Attending: Emergency Medicine | Admitting: Emergency Medicine

## 2016-11-20 DIAGNOSIS — F17210 Nicotine dependence, cigarettes, uncomplicated: Secondary | ICD-10-CM | POA: Insufficient documentation

## 2016-11-20 DIAGNOSIS — Z888 Allergy status to other drugs, medicaments and biological substances status: Secondary | ICD-10-CM

## 2016-11-20 DIAGNOSIS — Z96662 Presence of left artificial ankle joint: Secondary | ICD-10-CM | POA: Insufficient documentation

## 2016-11-20 DIAGNOSIS — F1220 Cannabis dependence, uncomplicated: Secondary | ICD-10-CM | POA: Diagnosis present

## 2016-11-20 DIAGNOSIS — Z96661 Presence of right artificial ankle joint: Secondary | ICD-10-CM | POA: Insufficient documentation

## 2016-11-20 DIAGNOSIS — Z79899 Other long term (current) drug therapy: Secondary | ICD-10-CM | POA: Diagnosis not present

## 2016-11-20 DIAGNOSIS — F909 Attention-deficit hyperactivity disorder, unspecified type: Secondary | ICD-10-CM | POA: Diagnosis not present

## 2016-11-20 DIAGNOSIS — F639 Impulse disorder, unspecified: Secondary | ICD-10-CM | POA: Diagnosis present

## 2016-11-20 DIAGNOSIS — F329 Major depressive disorder, single episode, unspecified: Secondary | ICD-10-CM | POA: Diagnosis not present

## 2016-11-20 DIAGNOSIS — Z9889 Other specified postprocedural states: Secondary | ICD-10-CM

## 2016-11-20 DIAGNOSIS — F3132 Bipolar disorder, current episode depressed, moderate: Secondary | ICD-10-CM | POA: Diagnosis present

## 2016-11-20 DIAGNOSIS — F603 Borderline personality disorder: Secondary | ICD-10-CM | POA: Diagnosis present

## 2016-11-20 DIAGNOSIS — F319 Bipolar disorder, unspecified: Secondary | ICD-10-CM | POA: Insufficient documentation

## 2016-11-20 DIAGNOSIS — R45851 Suicidal ideations: Secondary | ICD-10-CM | POA: Diagnosis present

## 2016-11-20 DIAGNOSIS — F3163 Bipolar disorder, current episode mixed, severe, without psychotic features: Secondary | ICD-10-CM | POA: Diagnosis present

## 2016-11-20 LAB — COMPREHENSIVE METABOLIC PANEL
ALK PHOS: 48 U/L (ref 38–126)
ALT: 36 U/L (ref 17–63)
ANION GAP: 8 (ref 5–15)
AST: 30 U/L (ref 15–41)
Albumin: 3.9 g/dL (ref 3.5–5.0)
BILIRUBIN TOTAL: 0.4 mg/dL (ref 0.3–1.2)
BUN: 12 mg/dL (ref 6–20)
CALCIUM: 8.9 mg/dL (ref 8.9–10.3)
CO2: 26 mmol/L (ref 22–32)
CREATININE: 0.86 mg/dL (ref 0.61–1.24)
Chloride: 103 mmol/L (ref 101–111)
Glucose, Bld: 100 mg/dL — ABNORMAL HIGH (ref 65–99)
Potassium: 3.8 mmol/L (ref 3.5–5.1)
Sodium: 137 mmol/L (ref 135–145)
TOTAL PROTEIN: 6.9 g/dL (ref 6.5–8.1)

## 2016-11-20 LAB — CBC
HEMATOCRIT: 41.3 % (ref 39.0–52.0)
Hemoglobin: 14.1 g/dL (ref 13.0–17.0)
MCH: 32 pg (ref 26.0–34.0)
MCHC: 34.1 g/dL (ref 30.0–36.0)
MCV: 93.9 fL (ref 78.0–100.0)
Platelets: 178 10*3/uL (ref 150–400)
RBC: 4.4 MIL/uL (ref 4.22–5.81)
RDW: 12.6 % (ref 11.5–15.5)
WBC: 6.3 10*3/uL (ref 4.0–10.5)

## 2016-11-20 LAB — HIV ANTIBODY (ROUTINE TESTING W REFLEX): HIV Screen 4th Generation wRfx: NONREACTIVE

## 2016-11-20 LAB — RAPID URINE DRUG SCREEN, HOSP PERFORMED
Amphetamines: NOT DETECTED
BARBITURATES: NOT DETECTED
Benzodiazepines: NOT DETECTED
COCAINE: NOT DETECTED
OPIATES: NOT DETECTED
TETRAHYDROCANNABINOL: POSITIVE — AB

## 2016-11-20 LAB — ETHANOL: Alcohol, Ethyl (B): <5 mg/dL (ref <5)

## 2016-11-20 LAB — RPR: RPR: NONREACTIVE

## 2016-11-20 LAB — SALICYLATE LEVEL

## 2016-11-20 LAB — ACETAMINOPHEN LEVEL

## 2016-11-20 MED ORDER — METOPROLOL SUCCINATE ER 25 MG PO TB24
25.0000 mg | ORAL_TABLET | Freq: Every day | ORAL | Status: DC
  Filled 2016-11-20 (×2): qty 1

## 2016-11-20 MED ORDER — TOPIRAMATE 25 MG PO TABS
50.0000 mg | ORAL_TABLET | Freq: Two times a day (BID) | ORAL | Status: DC
  Administered 2016-11-20 – 2016-11-21 (×2): 50 mg via ORAL
  Filled 2016-11-20 (×3): qty 2

## 2016-11-20 MED ORDER — QUETIAPINE FUMARATE 100 MG PO TABS: 100.0000 mg | ORAL_TABLET | Freq: Every evening | ORAL | Status: DC | PRN

## 2016-11-20 MED ORDER — QUETIAPINE FUMARATE 100 MG PO TABS
100.0000 mg | ORAL_TABLET | Freq: Every evening | ORAL | Status: DC | PRN
  Administered 2016-11-20 (×2): 100 mg via ORAL
  Filled 2016-11-20 (×2): qty 1

## 2016-11-20 MED ORDER — ALUM & MAG HYDROXIDE-SIMETH 200-200-20 MG/5ML PO SUSP
30.0000 mL | ORAL | Status: DC | PRN
  Administered 2016-11-20: 30 mL via ORAL
  Filled 2016-11-20: qty 30

## 2016-11-20 MED ORDER — NICOTINE POLACRILEX 2 MG MT GUM
2.0000 mg | CHEWING_GUM | OROMUCOSAL | Status: DC | PRN
  Administered 2016-11-20 (×2): 2 mg via ORAL
  Filled 2016-11-20 (×2): qty 1

## 2016-11-20 NOTE — Progress Notes (Signed)
11/20/16 1347:  LRT went to pt room, pt was sleep.  Caroll RancherMarjette Yecheskel Kurek, LRT/CTRS

## 2016-11-20 NOTE — ED Provider Notes (Signed)
[facility]-EMERGENCY DEPT Provider Note   CSN: [id number] Arrival date & time: 11/20/16  0248     History   Chief Complaint Chief Complaint  Patient presents with  . Suicidal    HPI Karl Carroll is a 25 y.o. male.  25 year old male with a history of bipolar 1 disorder and ADHD presents to the emergency department for suicidal ideations. He reports in triage that his thoughts have been ongoing for the past 2 weeks, though he tells me that he has been having suicidal ideations for the past month. He states that he has been off his Lamictal for the past 2 days. He has been easy to anger recently. No alcohol use today. He does report using marijuana earlier. Patient with no suicidal plan, homicidal ideations. He does have a history of multiple behavioral health hospitalizations and involuntary commitments.   The history is provided by the patient. No language interpreter was used.    Past Medical History:  Diagnosis Date  . ADHD (attention deficit hyperactivity disorder)   . Antisocial personality disorder   . Anxiety   . OCD (obsessive compulsive disorder)   . SVT (supraventricular tachycardia) [facility](HCC)     Patient Active Problem List   Diagnosis Date Noted  . Bipolar I disorder (HCC) 11/28/2015  . Bipolar 1 disorder (HCC) 11/28/2015  . Severe mixed bipolar 1 disorder without psychosis (HCC) 11/24/2015  . OCD (obsessive compulsive disorder) 11/22/2015  . Impulse control disorder 11/22/2015  . Borderline personality disorder 11/21/2015    Past Surgical History:  Procedure Laterality Date  . ANKLE ARTHROPLASTY Bilateral        Home Medications    Prior to Admission medications   Medication Sig Start Date End Date Taking? Authorizing Provider  metoprolol (LOPRESSOR) 25 MG tablet Take 1 tablet (25 mg total) by mouth daily. Patient not taking: Reported on 11/20/2016 12/22/15   Audry Piliyler Mohr, PA-C  metoprolol succinate (TOPROL-XL) 25 MG 24 hr tablet Take 1 tablet (25 mg total) by  mouth daily. For high blood pressure Patient not taking: Reported on 12/22/2015 11/28/15   Sanjuana KavaAgnes I Nwoko, NP  oxcarbazepine (TRILEPTAL) 600 MG tablet Take 1 tablet (600 mg total) by mouth 2 (two) times daily. For mood stabilization Patient not taking: Reported on 11/20/2016 11/28/15   Sanjuana KavaAgnes I Nwoko, NP  QUEtiapine (SEROQUEL) 100 MG tablet Take 1 tablet (100 mg total) by mouth at bedtime and may repeat dose one time if needed. For mood control Patient not taking: Reported on 12/22/2015 11/28/15   Sanjuana KavaAgnes I Nwoko, NP  sulfamethoxazole-trimethoprim (BACTRIM DS,SEPTRA DS) 800-160 MG tablet Take 1 tablet by mouth 2 (two) times daily. For wound infection Patient not taking: Reported on 12/22/2015 11/28/15   Sanjuana KavaAgnes I Nwoko, NP  topiramate (TOPAMAX) 50 MG tablet Take 1 tablet (50 mg total) by mouth 2 (two) times daily. For mood stabilization Patient not taking: Reported on 12/22/2015 11/28/15   Sanjuana KavaAgnes I Nwoko, NP    Family History No family history on file.  Social History Social History  Substance Use Topics  . Smoking status: Heavy Tobacco Smoker    Packs/day: 1.00    Types: Cigarettes  . Smokeless tobacco: Current User    Types: Snuff  . Alcohol use Yes     Comment: "Only on hoildays" But reports he has two drinking days since New Years.      Allergies   Effexor [venlafaxine]; Haldol [haloperidol lactate]; Darvon [propoxyphene]; Prozac [fluoxetine hcl]; Trinitrophenol; Ultram [tramadol hcl]; Wellbutrin [bupropion]; Celexa [citalopram hydrobromide]; Naprosyn [  naproxen]; and Tape   Review of Systems Review of Systems Ten systems reviewed and are negative for acute change, except as noted in the HPI.    Physical Exam Updated Vital Signs BP 136/68 (BP Location: Left Arm)   Pulse 97   Temp 98.4 F (36.9 C) (Oral)   Resp 20   SpO2 97%   Physical Exam  Constitutional: He is oriented to person, place, and time. He appears well-developed and well-nourished. No distress.  HENT:  Head:  Normocephalic and atraumatic.  Eyes: Conjunctivae and EOM are normal. No scleral icterus.  Neck: Normal range of motion.  Pulmonary/Chest: Effort normal. No respiratory distress.  Musculoskeletal: Normal range of motion.  Neurological: He is alert and oriented to person, place, and time.  Skin: Skin is warm and dry. No rash noted. He is not diaphoretic. No erythema. No pallor.  Psychiatric: He has a normal mood and affect. His speech is normal and behavior is normal. He expresses suicidal ideation. He expresses no homicidal ideation. He expresses no suicidal plans and no homicidal plans.  Flat affect  Nursing note and vitals reviewed.    ED Treatments / Results  Labs (all labs ordered are listed, but only abnormal results are displayed) Labs Reviewed  COMPREHENSIVE METABOLIC PANEL - Abnormal; Notable for the following:       Result Value   Glucose, Bld 100 (*)    All other components within normal limits  ACETAMINOPHEN LEVEL - Abnormal; Notable for the following:    Acetaminophen (Tylenol), Serum <10 (*)    All other components within normal limits  RAPID URINE DRUG SCREEN, HOSP PERFORMED - Abnormal; Notable for the following:    Tetrahydrocannabinol POSITIVE (*)    All other components within normal limits  ETHANOL  SALICYLATE LEVEL  CBC  HIV ANTIBODY (ROUTINE TESTING)  RPR  GC/CHLAMYDIA PROBE AMP ([place]) NOT AT [facility]    EKG  EKG Interpretation None       Radiology No results found.  Procedures Procedures (including critical care time)  Medications Ordered in ED Medications  metoprolol succinate (TOPROL-XL) 24 hr tablet 25 mg (not administered)  QUEtiapine (SEROQUEL) tablet 100 mg (not administered)  topiramate (TOPAMAX) tablet 50 mg (not administered)     Initial Impression / Assessment and Plan / ED Course  I have reviewed the triage vital signs and the nursing notes.  Pertinent labs & imaging results that were available during my care of the patient  were reviewed by me and considered in my medical decision making (see chart for details).  Clinical Course     Patient medically cleared. He is voluntary, presenting for SI. Hx of medication noncompliance. TTS to seek placement.   Final Clinical Impressions(s) / ED Diagnoses   Final diagnoses:  Bipolar affective disorder, remission status unspecified [facility](HCC)    New Prescriptions New Prescriptions   No medications on file     Antony MaduraKelly Keilany Burnette, PA-C 11/20/16 0539    Dione Boozeavid Glick, MD 11/20/16 [phone]

## 2016-11-20 NOTE — H&P (Signed)
Behavioral Health Medical Screening Exam  Karl GinsJoseph Carroll is an 25 y.o. male, [facility] endorsing depressive symptoms with SI/Plan. Patient is denying any significant pmhx or concurrent use of prescription medications.  Total Time spent with patient: 20 minutes  Psychiatric Specialty Exam: Physical Exam  Constitutional: He is oriented to person, place, and time. He appears well-developed and well-nourished. No distress.  HENT:  Head: Normocephalic.  Eyes: Pupils are equal, round, and reactive to light.  Neurological: He is alert and oriented to person, place, and time. No cranial nerve deficit.  Skin: Skin is warm and dry. He is not diaphoretic.  Psychiatric: His speech is normal. His mood appears anxious. He is agitated. Cognition and memory are normal. He expresses impulsivity. He exhibits a depressed mood. He expresses suicidal ideation.    Review of Systems  Psychiatric/Behavioral: Positive for depression, substance abuse and suicidal ideas. The patient is nervous/anxious.   All other systems reviewed and are negative.   There were no vitals taken for this visit.There is no height or weight on file to calculate BMI.  General Appearance: Casual  Eye Contact:  Good  Speech:  Clear and Coherent  Volume:  Normal  Mood:  Depressed  Affect:  Congruent  Thought Process:  Goal Directed  Orientation:  Full (Time, Place, and Person)  Thought Content:  Negative  Suicidal Thoughts:  Yes.  with intent/plan  Homicidal Thoughts:  No  Memory:  Immediate;   Good  Judgement:  Impaired  Insight:  Lacking  Psychomotor Activity:  Negative  Concentration: Concentration: Fair  Recall:  Fair  Fund of Knowledge:Fair  Language: Fair  Akathisia:  Negative  Handed:  Right  AIMS (if indicated):     Assets:  Desire for Improvement  Sleep:       Musculoskeletal: Strength & Muscle Tone: within normal limits Gait & Station: normal Patient leans: N/A  There were no vitals  taken for this visit.  Recommendations:  Based on my evaluation the patient does not appear to have an emergency medical condition.  Verda Mehta E, PA-C 11/20/2016, 6:09 AM

## 2016-11-20 NOTE — ED Notes (Signed)
Pt oriented to room and unit.  Pt complains of suicidal thoughts x 1 month.  Pt contracts for safety.  He  is pleasant and cooperative.  15 minute checks and video monitoring in place.

## 2016-11-20 NOTE — ED Notes (Signed)
Pt is pleasant and cooperative.  States he continues to have suicidal thoughts but contracts for safety.  Pt denies H/I and AVH.  15 minute checks and video monitoring continue.
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
## 2016-11-20 NOTE — ED Notes (Signed)
Pt A&O x 3, no distress noted, calm & cooperative at present.  Watching TV at present.  Monitoring for safety, Q 15 min checks in effect. 

## 2016-11-20 NOTE — ED Triage Notes (Signed)
Pt presenting with suicidal thoughts for past 2 weeks. No current plan- states he has a good support system and would like to modify his medications and treatment plan. Calm and cooperative during triage.

## 2016-11-21 DIAGNOSIS — F3132 Bipolar disorder, current episode depressed, moderate: Secondary | ICD-10-CM | POA: Diagnosis present

## 2016-11-21 LAB — GC/CHLAMYDIA PROBE AMP (~~LOC~~) NOT AT ARMC
CHLAMYDIA, DNA PROBE: NEGATIVE
NEISSERIA GONORRHEA: NEGATIVE

## 2016-11-21 MED ORDER — QUETIAPINE FUMARATE 100 MG PO TABS: 100.0000 mg | ORAL_TABLET | Freq: Every evening | ORAL | 0 refills | Status: DC | PRN

## 2016-11-21 MED ORDER — QUETIAPINE FUMARATE 100 MG PO TABS: 100.0000 mg | ORAL_TABLET | Freq: Every evening | ORAL | 0 refills | Status: AC | PRN

## 2016-11-21 MED ORDER — TOPIRAMATE 50 MG PO TABS: 50.0000 mg | ORAL_TABLET | Freq: Two times a day (BID) | ORAL | 0 refills | Status: AC
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
## 2016-11-21 NOTE — [facility] Suicide Risk Assessment (Signed)
Suicide Risk Assessment  Discharge Assessment   [facility]   Principal Problem: Bipolar affective disorder, depressed, moderate ([facility]) Discharge Diagnoses:  Patient Active Problem List   Diagnosis Date Noted  . Bipolar affective disorder, depressed, moderate ([facility]) [F31.32] 11/21/2016    Priority: High  . Borderline personality disorder [F60.3] 11/21/2015    Priority: High  . Cannabis use disorder, moderate, dependence ([facility]) [F12.20] 11/20/2016  . Bipolar 1 disorder ([facility]) [F31.9] 11/28/2015  . OCD (obsessive compulsive disorder) [F42.9] 11/22/2015  . Impulse control disorder [F63.9] 11/22/2015    Total Time spent with patient: 30 minutes   Musculoskeletal: Strength & Muscle Tone: within normal limits Gait & Station: normal Patient leans: N/A  Psychiatric Specialty Exam: Physical Exam  Constitutional: He is oriented to person, place, and time. He appears well-developed and well-nourished.  HENT:  Head: Normocephalic.  Neck: Normal range of motion.  Respiratory: Effort normal.  Musculoskeletal: Normal range of motion.  Neurological: He is alert and oriented to person, place, and time.  Psychiatric: His speech is normal and behavior is normal. Judgment normal. Cognition and memory are normal. He exhibits a depressed mood. He expresses homicidal and suicidal ideation.    Review of Systems  Psychiatric/Behavioral: Positive for depression.  All other systems reviewed and are negative.   Blood pressure 104/59, pulse 70, temperature 98.6 F (37 C), temperature source Oral, resp. rate 19, SpO2 98 %.There is no height or weight on file to calculate BMI.  General Appearance: Casual  Eye Contact:  Good  Speech:  Normal Rate  Volume:  Normal  Mood:  Depressed, mild  Affect:  Congruent  Thought Process:  Coherent and Descriptions of Associations: Intact  Orientation:  Full (Time, Place, and Person)  Thought Content:  WDL  Suicidal Thoughts:  No   Homicidal Thoughts:  No  Memory: Immediate, good; recent, good; remote, good  Judgement:  Fair  Insight:  Fair  Psychomotor Activity:  Normal  Concentration:  Concentration and attention span, good  Recall:  Good  Fund of Knowledge:  Fair  Language:  Good  Akathisia:  No  Handed:  Right  AIMS (if indicated):     Assets:  Leisure Time Physical Health Resilience  ADL's:  Intact  Cognition:  WNL  Sleep:      Mental Status Per Nursing Assessment::   On Admission:   suicidal and homicidal ideatoins  Demographic Factors:  Male, Caucasian and Gay, lesbian, or bisexual orientation  Loss Factors: NA  Historical Factors: NA  Risk Reduction Factors:   Sense of responsibility to family, Living with another person, especially a relative and Positive social support  Continued Clinical Symptoms:  Depression, mild  Cognitive Features That Contribute To Risk:  None    Suicide Risk:  Minimal: No identifiable suicidal ideation.  Patients presenting with no risk factors but with morbid ruminations; may be classified as minimal risk based on the severity of the depressive symptoms    Plan Of Care/Follow-up recommendations:  Activity:  as tolerated Diet:  heart healthy diet  LORD, JAMISON, NP 11/21/2016, 9:24 AM

## 2016-11-21 NOTE — ED Notes (Signed)
Patient discharged to home.  All belongings returned and signed for.  Reviewed all discharge instructions, medications and follow up care.  Patient verbalized understanding.  He denies current thoughts of harm to self or others.  He denies hallucinations.  He left the unit ambulatory and was escorted to the front lobby.  [place]
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
